# Patient Record
Sex: Male | Born: 1965
Health system: Southern US, Community
[De-identification: ages and names within clinical notes are randomized; demographics above are authoritative.]

---

## 1998-06-01 ENCOUNTER — Emergency Department (HOSPITAL_COMMUNITY): Admission: EM | Admit: 1998-06-01 | Discharge: 1998-06-01 | Payer: Self-pay | Admitting: Emergency Medicine

## 1998-06-01 ENCOUNTER — Encounter: Payer: Self-pay | Admitting: Emergency Medicine

## 1998-11-16 ENCOUNTER — Inpatient Hospital Stay: Admission: EM | Admit: 1998-11-16 | Discharge: 1998-11-21 | Payer: Self-pay | Admitting: Emergency Medicine

## 1998-11-16 ENCOUNTER — Encounter: Payer: Self-pay | Admitting: Emergency Medicine

## 1998-11-17 ENCOUNTER — Encounter: Payer: Self-pay | Admitting: Geriatric Medicine

## 1998-11-18 ENCOUNTER — Encounter: Payer: Self-pay | Admitting: Geriatric Medicine

## 1998-11-19 ENCOUNTER — Encounter: Payer: Self-pay | Admitting: Geriatric Medicine

## 1998-11-20 ENCOUNTER — Encounter: Payer: Self-pay | Admitting: Internal Medicine

## 1998-12-08 ENCOUNTER — Inpatient Hospital Stay: Admission: EM | Admit: 1998-12-08 | Discharge: 1998-12-12 | Payer: Self-pay

## 1998-12-11 ENCOUNTER — Encounter: Payer: Self-pay | Admitting: Internal Medicine

## 1999-10-10 ENCOUNTER — Encounter: Admission: RE | Admit: 1999-10-10 | Discharge: 2000-01-08 | Payer: Self-pay | Admitting: Geriatric Medicine

## 2002-01-26 ENCOUNTER — Encounter: Payer: Self-pay | Admitting: Geriatric Medicine

## 2002-01-26 ENCOUNTER — Encounter: Admission: RE | Admit: 2002-01-26 | Discharge: 2002-01-26 | Payer: Self-pay | Admitting: Geriatric Medicine

## 2002-01-29 ENCOUNTER — Encounter: Payer: Self-pay | Admitting: *Deleted

## 2002-01-29 ENCOUNTER — Inpatient Hospital Stay (HOSPITAL_COMMUNITY): Admission: EM | Admit: 2002-01-29 | Discharge: 2002-01-31 | Payer: Self-pay | Admitting: Emergency Medicine

## 2004-03-29 ENCOUNTER — Inpatient Hospital Stay (HOSPITAL_COMMUNITY): Admission: EM | Admit: 2004-03-29 | Discharge: 2004-03-30 | Payer: Self-pay

## 2004-06-17 ENCOUNTER — Ambulatory Visit: Payer: Self-pay | Admitting: Pulmonary Disease

## 2004-06-17 ENCOUNTER — Inpatient Hospital Stay (HOSPITAL_COMMUNITY): Admission: EM | Admit: 2004-06-17 | Discharge: 2004-06-29 | Payer: Self-pay | Admitting: Emergency Medicine

## 2004-07-21 ENCOUNTER — Encounter: Admission: RE | Admit: 2004-07-21 | Discharge: 2004-07-21 | Payer: Self-pay | Admitting: Geriatric Medicine

## 2004-08-08 ENCOUNTER — Encounter: Admission: RE | Admit: 2004-08-08 | Discharge: 2004-08-08 | Payer: Self-pay | Admitting: Geriatric Medicine

## 2005-04-30 ENCOUNTER — Emergency Department (HOSPITAL_COMMUNITY): Admission: EM | Admit: 2005-04-30 | Discharge: 2005-05-01 | Payer: Self-pay | Admitting: Emergency Medicine

## 2006-07-16 IMAGING — CR DG CHEST 1V PORT
1 series · 1 of 1 positions shown · non-contrast
Comparison: 06/21/04.

CLINICAL DATA: Pneumonia, respiratory distress.
 CHEST PORTABLE, ONE VIEW, 06/22/04:

[view not recorded]
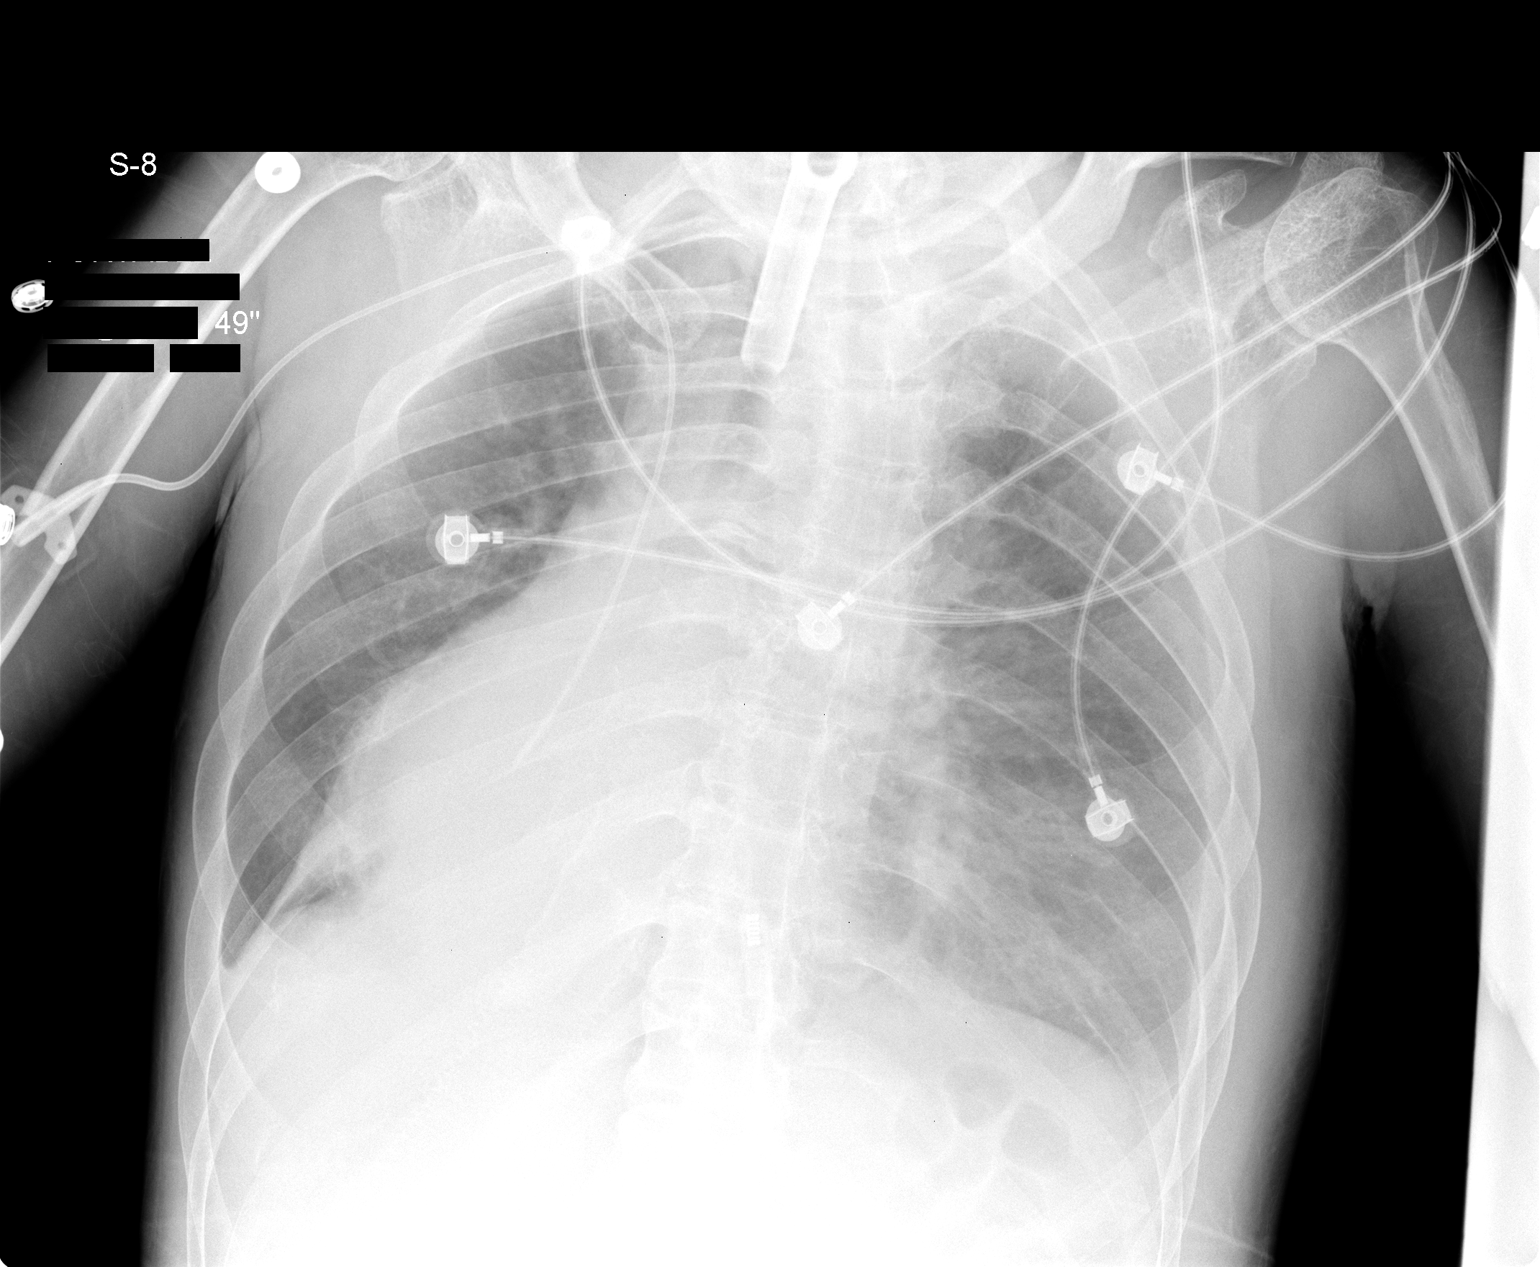

[1 of 1 positions shown; findings below may reference images not displayed]

FINDINGS: Right-sided central line is present and projects over the right side of the heart.  There is some airspace opacity at the right lung base with obscuration of the right medial hemidiaphragm, consistent with right lower lobe atelectasis or pneumothorax.  Mild interstitial prominence is present in the lung bases bilaterally.  The patient is moderately rotated to the right on today?s exam.  Tracheostomy tube is in place.
IMPRESSION: Right lower lobe atelectasis or pneumonia.  Faint interstitial prominence is present in the lung bases.

## 2006-11-05 ENCOUNTER — Inpatient Hospital Stay (HOSPITAL_COMMUNITY): Admission: EM | Admit: 2006-11-05 | Discharge: 2006-11-11 | Payer: Self-pay | Admitting: Emergency Medicine

## 2006-12-27 ENCOUNTER — Encounter: Admission: RE | Admit: 2006-12-27 | Discharge: 2006-12-27 | Payer: Self-pay | Admitting: Geriatric Medicine

## 2007-07-21 ENCOUNTER — Emergency Department (HOSPITAL_COMMUNITY): Admission: EM | Admit: 2007-07-21 | Discharge: 2007-07-22 | Payer: Self-pay | Admitting: Emergency Medicine

## 2007-07-27 ENCOUNTER — Inpatient Hospital Stay (HOSPITAL_COMMUNITY): Admission: EM | Admit: 2007-07-27 | Discharge: 2007-08-10 | Payer: Self-pay | Admitting: Emergency Medicine

## 2007-07-27 ENCOUNTER — Ambulatory Visit: Payer: Self-pay | Admitting: Pulmonary Disease

## 2007-12-14 ENCOUNTER — Ambulatory Visit: Payer: Self-pay | Admitting: Critical Care Medicine

## 2007-12-14 ENCOUNTER — Inpatient Hospital Stay (HOSPITAL_COMMUNITY): Admission: EM | Admit: 2007-12-14 | Discharge: 2007-12-23 | Payer: Self-pay | Admitting: Emergency Medicine

## 2008-01-29 ENCOUNTER — Emergency Department (HOSPITAL_COMMUNITY): Admission: EM | Admit: 2008-01-29 | Discharge: 2008-01-30 | Payer: Self-pay | Admitting: Emergency Medicine

## 2008-10-04 ENCOUNTER — Emergency Department (HOSPITAL_COMMUNITY): Admission: EM | Admit: 2008-10-04 | Discharge: 2008-10-04 | Payer: Self-pay | Admitting: Emergency Medicine

## 2009-07-13 ENCOUNTER — Encounter: Payer: Self-pay | Admitting: Critical Care Medicine

## 2009-08-04 ENCOUNTER — Encounter: Payer: Self-pay | Admitting: Critical Care Medicine

## 2009-11-12 ENCOUNTER — Encounter: Payer: Self-pay | Admitting: Critical Care Medicine

## 2010-01-04 ENCOUNTER — Encounter: Payer: Self-pay | Admitting: Critical Care Medicine

## 2010-08-22 NOTE — Letter (Signed)
Summary: [facility] [employer]  [facility] [employer]   Imported By: Lester Canfield 07/27/2009 09:41:10  _____________________________________________________________________  External Attachment:    Type:   Image     Comment:   External Document

## 2010-08-22 NOTE — Letter (Signed)
Summary: [facility]  [facility]   Imported By: Lester [place] 01/10/2010 07:42:42  _____________________________________________________________________  External Attachment:    Type:   Image     Comment:   External Document

## 2010-08-22 NOTE — Letter (Signed)
Summary: [employer] / [facility]  [employer] / [facility]   Imported By: Lennie Odor 11/18/2009 14:41:34  _____________________________________________________________________  External Attachment:    Type:   Image     Comment:   External Document

## 2010-08-25 NOTE — Letter (Signed)
Summary: [facility] [employer]  [employer] [employer]   Imported By: Lester Boyden 08/08/2009 08:38:37  _____________________________________________________________________  External Attachment:    Type:   Image     Comment:   External Document

## 2010-11-02 LAB — CULTURE, RESPIRATORY W GRAM STAIN

## 2010-11-02 LAB — URINE MICROSCOPIC-ADD ON

## 2010-11-02 LAB — URINE CULTURE: Colony Count: NO GROWTH

## 2010-11-02 LAB — URINALYSIS, ROUTINE W REFLEX MICROSCOPIC
Glucose, UA: NEGATIVE mg/dL
Leukocytes, UA: NEGATIVE
pH: 5.5 (ref 5.0–8.0)

## 2010-12-05 NOTE — H&P (Signed)
NAME:  [name], [name] NO.:  [id number]   MEDICAL RECORD NO.:  [id number]          PATIENT TYPE:  INP   LOCATION:  0103                         FACILITY:  [facility]   PHYSICIAN:  Ramiro Harvest, MD    DATE OF BIRTH:  [birth year]   DATE OF ADMISSION:  12/14/2007  DATE OF DISCHARGE:                              HISTORY & PHYSICAL   PRIMARY CARE PHYSICIAN:  Hal T. Stoneking, M.D.   HISTORY OF PRESENT ILLNESS:  [name] is a 45 year old white male  with history of cerebral palsy, nonverbal, nonambulatory, history of  seizure disorder, history of anxiety, status post tracheostomy, status  post a PEG tube, history of multiple allergies to medications recently  hospitalized in January of 2009 for pneumonia who presents from home  with a several-hour history of increased secretions, congestion,  tachycardia, tachypnea, fever with a temperature as high as 102,  productive cough of tannish sputum, wheezing and shortness of breath.  No nausea, no vomiting.  No abdominal pain, no chest pain, no  hematemesis, no hematochezia, no melena.  Liquid stools are unchanged.  History was obtained from 24-hour nurse and family.  The patient's  primary care group was called last night secondary to the patient's  symptoms, and azithromycin was called in.  The patient only got 1 dose  of azithromycin as well as a Tylenol suppository.  The patient was given  some nebulizer treatments at home with no improvement of symptoms.  EMS  was then called, and the patient was brought to the emergency room.  In  the ED, the patient was found to have a pH of 7.45, PCO2 of 35, PO2 of  53.  Chest x-ray showed a left lower lobe pneumonia.  The patient had an  elevated white count 15.4, temperature of 101, heart rate in the 160s to  170s, and we were called to admit the patient for further evaluation and  management.   ALLERGIES:  BIAXIN, CIPRO, DEMEROL, KEFLEX, PENICILLIN, PRIMAXIN, ZOSYN,  SULFA.   THE PATIENT, HOWEVER, HAS TOLERATED ROCEPHIN AND AZITHROMYCIN IN  THE PAST PER 24-HOUR NURSE.   PAST MEDICAL HISTORY:  1. History of chronic sinus tachycardia since January of 2009.  2. Anxiety disorder.  3. History of seizure disorder.  4. Cerebral palsy.  5. Status post tracheostomy.  6. Status post PEG.  7. History of recurrent small-bowel obstruction secondary to      adhesions.  8. Chronic dislocated right hip.  9. History of recurrent admissions for pneumonia.   MEDICATIONS:  1. Robitussin DM 10 mL q.6h.  2. Phenobarbital 32.4 mg per PEG q.a.m. and 64.8 mg per PEG nightly.  3. Flintstone vitamin daily.  4. Metoprolol 50 mg b.i.d. per PEG.  5. Milk of Magnesia 2.5 mL daily per PEG.  6. Melatonin 3 mg tab per PEG nightly.  7. Simethicone 80 mg t.i.d. per PEG p.r.n.  8. Lotrimin 0.1% cream to trach b.i.d. as needed.  9. Dulcolax suppository per rectum p.r.n.  10.Mylanta 200/20 mg per 5 mL 10-20 mL q.4-6h. p.r.n.  11.Tylenol 325 mg 1-2 tablets q.4-6h. p.r.n. per PEG.  12.Phenergan 12.5 mg suppository, 1/2 suppository q.6h. p.r.n.  13.Normal saline 1-3 mL p.r.n. trach.  14.Ativan 0.5 mg per PEG nightly.  15.Atrovent 0.5 to 1 mg q.4h. p.r.n. per PEG.  16.Xopenex 1.25 mg nebulizer q.4h. p.r.n.  17.O2 at 1-4 liters per minute as needed.  18.Free water flushes 20 mL every half hour p.r.n. when volume      depleted and free water flushes 200 mL q.i.d. per PEG during waking      hours.  19.Osmolite 1.5 calories at 45 mL per hour.  Hold for residuals      greater than 200 mL.  20.Small sponge between fourth and fifth digits of the left foot for      pressure relief.  21.Zithromax.   SOCIAL HISTORY:  The patient lives at home with his parents and a 13-  hour sitter.  No tobacco, no alcohol, no IV drug use.  The patient is  nonverbal and nonambulatory.   FAMILY HISTORY:  Noncontributory.   REVIEW OF SYSTEMS:  Unable to obtain.   PHYSICAL EXAMINATION:  VITAL SIGNS:   Temperature 101, blood pressure  125/85, pulse ranging from 151-167, respiratory rate 40, sating 93% on 2  liters trach.  [name]:  The patient is nonverbal.  HEENT:  Normocephalic, atraumatic.  Pupils equal, round and reactive to  light.  Extraocular movements intact.  Oropharynx is dry, clear.  No  lesions, no exudates.  NECK:  Supple.  No lymphadenopathy.  RESPIRATORY:  Diffuse rhonchi.  Expiratory wheezes.  CARDIOVASCULAR:  Tachycardiac, regular rhythm.  ABDOMEN:  Distended.  Positive bowel sounds.  Nontender to palpation.  Soft.  EXTREMITIES:  In contractures.  No clubbing, cyanosis or edema.  NEUROLOGICAL:  The patient is alert, nonverbal.   LABORATORY DATA:  CBC:  White count 15.4, hemoglobin 16.3, platelets  207, hematocrit 47.0, ANC of 13.4.  I-Stat:  Sodium 138, potassium 5.0,  chloride 109, BUN 19, creatinine 0.9, glucose 114.  ANC of 13.4, pH  7.45, PCO2 35, PO2 53, bicarb 23.   EKG shows sinus tachycardia.  Chest x-ray shows a bronchopneumonia in  the left lower base, stable mild cardiomegaly, pulmonary venous  hypertension without edema.   ASSESSMENT AND PLAN:  Mr. [name] is a 45 year old gentleman  with a history of cerebral palsy, history of anxiety disorder, seizure  disorder, recurrent hospitalizations for pneumonia recently hospitalized  in January for pneumonia and multiple drug allergies who presents to the  emergency department with fever, increased secretions, tachycardia,  tachypnea, tannish sputum, wheezing, and shortness of breath found to  have a pneumonia per chest x-ray.  1. Left lower lobe pneumonia.  Patient with multiple hospitalizations      for pneumonia recently hospitalized in January of 2009.  The      patient with a fever, tachycardia, tachypnea.  Will admit to the      step-down unit.  Will check a sputum, gram stain, and cultures.      Check blood cultures x2.  Check a urinalysis, cultures and      sensitivity.  Check a urine  Legionella and pneumococcus antigen.      Check a comprehensive metabolic profile.  Check an arterial blood      gas.  Will place the patient on oxygen, nebulizer treatments,      Mucinex, intravenous fluids and empirically with intravenous      vancomycin and gentamicin, as the patient has multiple allergies.      Will consult with CCM for line  placement.  2. Anxiety.  Continue home dose of Ativan.  3. Seizure disorder.  Check a phenobarbital level.  Restart home dose      phenobarbital.  4. Cerebral palsy.  5. Sinus tachycardia likely secondary to problem #1.  Intravenous      fluids, Lopressor 5 mg intravenously daily, and continue home dose      beta blocker.  6. Status post tracheostomy.  7. Status post percutaneous gastrostomy tube placement.  Continue      Osmolite per percutaneous gastrostomy tube and free water flushes.  8. History of recurrent small-bowel obstruction.  9. Prophylaxis.  Protonix for gastrointestinal prophylaxis.  Lovenox      for deep venous thrombosis prophylaxis.   It has been a pleasure taking care of Mr. Konnar Ben.      Ramiro Harvest, MD  Electronically Signed     DT/MEDQ  D:  12/14/2007  T:  12/14/2007  Job:  [id number]   cc:   Hal T. Stoneking, M.D.  Fax: [phone]

## 2010-12-05 NOTE — Discharge Summary (Signed)
NAMEMarland Kitchen  [name], [name]             ACCOUNT NO.:  [id number]   MEDICAL RECORD NO.:  [id number]          PATIENT TYPE:  INP   LOCATION:  1403                         FACILITY:  [facility]   PHYSICIAN:  Kela Millin, M.D.DATE OF BIRTH:  25-Nov-1965   DATE OF ADMISSION:  12/14/2007  DATE OF DISCHARGE:  12/23/2007                               DISCHARGE SUMMARY   DISCHARGE DIAGNOSES:  1. Left lower lobe pneumonia, pseudomonas.  2. Abnormal liver function tests, resolved.  3. Cerebral palsy.  4. History of sinus tachycardia.  5. Anxiety disorder.  6. Seizure disorder.  7. History of recurrent small bowel obstruction secondary to adhesion.  8. Chronic dislocated right hip.  9. History of recurrent admissions for pneumonia.  10.Status post tracheostomy.  11.Status post percutaneous endoscopic gastrostomy tube.   HISTORY:  The patient is a 45 year old white male with above listed  medical problems and multiple medication allergies, who presented with a  several hour history of increased secretions, congestion, tachycardia,  tachypnea, fever with a temperature as high as 102 as well as a cough  productive of tan sputum.  No nausea or vomiting reported.  In the ER, a  chest x-ray was done which showed left lower lobe pneumonia.  An ABG  showed a pH of 7.45 with a PCO2 of 35, PO2 of 53.  His white count was  elevated at 15.4 with a temperature of 101 and his heart rate in the 160-  170s.  He was admitted for further evaluation and management.  Please  see the full admission history and physical dictated on Dec 14, 2007 by  Dr. Ramiro [name] for the details of the admission physical  examination as well as the laboratory data.   HOSPITAL COURSE:  1. Left lower lobe pneumonia, pseudomonas.  Upon admission, the      patient was empirically started on IV vancomycin and gentamicin      given his multiple antibiotic allergies after cultures were done.      He was initially monitored in the ICU  and received aggressive      pulmonary toileting as well as nebulized bronchodilators.  The      blood cultures did not grow any bacteria.  The trache aspirates      grew pseudomonas that was only resistant to Cipro, but given his      multiple antibiotic allergies, he was maintained on gentamicin.      The blood cultures remained negative until the vancomycin was      discontinued.  The patient's blood pressures subsequently      stabilized.  Also his tachycardia improved.  In the past 48 hours,      his heart rates have ranged from 75-106.  He has been on gentamicin      for 10 days which is adequate for the treatment of his pneumonia.      He has been afebrile and hemodynamically stable.  His leukocytosis      has resolved.  His last white cell count today is 7.8.  He will be      discharged home at  this time.  He is to follow up with his primary      care physician.  2. Abnormal liver function tests.  Upon admission, his SGOT was 61      with SGPT of 141.  The other LFTs were within normal limits.  The      etiology was unclear, but on recheck, they had normalized.  SGOT of      25, SGPT of 53.  3. Seizure disorder.  The patient was maintained on his phenobarbital      during his hospital stay.  No seizures are reported during his      hospitalization.  4. History of anxiety.  The patient was maintained on his Ativan      during his hospital stay.   DISCHARGE MEDICATIONS:  1. Xopenex nebulizers 1.25 mg q.4 h. p.r.n.  2. Robitussin DM 10 cc q.6 h. p.r.n. via PEG.  3. Phenobarbital 32.4 mg q.a.m. and 64.8 mg q.h.s.  4. Flintstone Vitamins daily.  5. Metoprolol 50 mg b.i.d. per PEG.  6. Milk of Magnesia 2.5 cc daily.  7. Melatonin 3 mg via PEG q.h.s.  8. Simethicone 8 mg t.i.d. p.r.n.  9. Lotrimin 0.1% applied to trache area b.i.d. p.r.n.  10.Dulcolax suppository as previously p.r.n.  11.Ativan 0.5 mg q.h.s.   FOLLOW UP:  Dr. [name] as scheduled.   CONDITION ON DISCHARGE:   Improved, stable.      Kela Millin, M.D.  Electronically Signed     ACV/MEDQ  D:  12/23/2007  T:  12/23/2007  Job:  [id number]   cc:   [name], M.D.  Fax: [phone]

## 2010-12-05 NOTE — Discharge Summary (Signed)
NAMEMarland Kitchen  [name], [name]             ACCOUNT NO.:  [id number]   MEDICAL RECORD NO.:  [id number]          PATIENT TYPE:  INP   LOCATION:  1438                         FACILITY:  [facility]   PHYSICIAN:  [name], MDDATE OF BIRTH:  October 27, 1965   DATE OF ADMISSION:  August 12, 2007  DATE OF DISCHARGE:  08/10/2007                               DISCHARGE SUMMARY   ATTENDING PHYSICIAN:  [name] Cowboy, MD   DISCHARGE DIAGNOSES:  1. Pneumonia.  2. Chronic sinus tachycardia.  3. Anxiety.  4. History of seizure disorder.  5. Cerebral palsy.  6. Status post tracheostomy and PEG (percutaneous endoscopic      gastrostomy) tube placement.  7. History of small bowel obstruction.   PROCEDURES:  Central line placement on July 28, 2007.   STUDIES:  1. Chest x-ray done on 12-Aug-2007, showing bilateral lower lobe      pneumonia.  2. Chest x-ray July 28, 2007, showing persistent lower lobe      pneumonia.  3. Chest x-ray on July 30, 2007, showing improved lower lobe      pneumonia right side soft tissue density in bronchus intermedius      likely mucus plug and secretions.  4. Chest x-ray on July 31, 2007, showing persistent air space      disease.  5. Chest x-ray on August 06, 2007, showing interval near complete      clearing of left lower lobe pneumonia.   HISTORY AND PHYSICAL:  Please see full H and P but briefly this is an  unfortunate 45 year old gentleman with history of advanced cerebral  palsy status post trach and PEG as well as question of a seizure  disorder who presented from home where he resides with his elderly  parents and 24 hour nursing with respiratory distress and increased  tracheostomy secretions.  Chest x-ray obtained which showed bilateral  pneumonia.  Secondary to multiple allergies the patient was started on  gentamicin for presumptive possible pseudomonas infection .  the patient  is tachycardic with heart rate 150.   HOSPITAL COURSE:   Concerning his pneumonia the patient completed a 12  day course of IV gent, creatinine stayed within normal limits.  Per  chest x-ray pneumonia has improved.  He required frequent suctioning  which was provided by the nursing here as well as by his home nursing  company that stayed at his bedside throughout the admission.   Concerning his tachycardia this appears to be a chronic problem per  records reviewed at home.  We made sure that the patient was restarted  on his home dose of p.r.n. Ativan and also scheduled Ativan at night for  agitation.  TSH was checked and was within normal limits.  The patient  does not appear to be dehydrated.  We started him on a low dose of  metoprolol 12.5 mg p.o. twice a day.  The patient to have followup with  [name].  His heart rate at baseline stays at high 90s to 120s and  does go up to 150s when the patient becomes agitated when he needs to be  suctioned.   Questionable history of seizures.  Per discussion with the nursing staff  the patient may have questionable seizures where he has some jerking  movements of his right arm at home.  His phenobarbital level was checked  and was on the low side of normal at 14.  His phenobarbital dose was  increased to 45 in the morning and 60 at night. But the patient became  somewhat more somnolent , given that his phenobarbital level on his home  dose was only slightly below norm and no seizure activity we will  discharge him on his home dose.  The patient has been on his standard  dose of phenobarbital for years.  Will refer to [name] if he  needs any further adjustments.  At the time of discharge his  phenobarbital level is 16.5.   DISCHARGE MEDICATIONS:  1. Phenobarbital 32.4 per PEG in the morning and 54.8 per PEG in the      evening.  2. Lotrimin 0.1% cream to trach twice a day.  3. Multivitamin per PEG daily.  4. Dulcolax suppository per rectum as needed.  5. Mylanta per PEG as needed.   6. Phenergan per rectum as needed q.6 h.  7. Ativan 0.5 mg p.o. q.4 h p.r.n. agitation and 0.5 mg p.o. per PEG      tube daily at bedtime.  8. Robitussin DM 10 mL per PEG every 6 hours as needed.  9. Xopenex nebs inhaled q.4 h as needed.  10.Metoprolol 12.5 mg p.o. b.i.d.  11.The patient is to go home with Osmolite 1.5 cal tube feeds at 45 mL      an hour, hold for residual above 200 mL, free water boluses at 250      mL per G-tube 4x a day.   FOLLOWUP:  The patient is to follow up with [name].  The patient  is to return and seek immediate medical attention if he develops severe  respiratory distress.  The patient is to continue the suction as needed  every 2-4 hours or as needed per nursing.      [name] Cowboy, MD  Electronically Signed     AVD/MEDQ  D:  08/10/2007  T:  08/10/2007  Job:  [id number]   cc:   [name], M.D.  Fax: [phone]

## 2010-12-05 NOTE — H&P (Signed)
NAME:  [name], TISSUE NO.:  [id number]   MEDICAL RECORD NO.:  [id number]          PATIENT TYPE:  INP   LOCATION:  0102                         FACILITY:  [facility]   PHYSICIAN:  Hollice Espy, M.D.DATE OF BIRTH:  [birth year]   DATE OF ADMISSION:  07/27/2007  DATE OF DISCHARGE:                              HISTORY & PHYSICAL   PRIMARY CARE PHYSICIAN:  Hal T. Stoneking, M.D.   CHIEF COMPLAINT:  Shortness of breath.   HISTORY OF PRESENT ILLNESS:  The patient is a 45 year old white male  with a past medical history of advanced cerebral palsy, as well as  status post tracheostomy and PEG and seizure disorder who, for the last  week, is having issues with respiratory distress, increased tracheostomy  secretions, and was found to have a pneumonia.  He came to the emergency  room 6 days ago.  At the time, he was noted to be mildly tachycardic,  and he was given IV fluids to improve his heart rate, as well as started  on Zithromax.  Apparently, he has multiple drug allergies.  The patient  was sent home.  He is cared for at home by family, as well as a 24-hour  nurse, and they noted that the patient appeared to have increased  secretions, a temperature spike, and they brought him back into the  emergency room on July 27, 2007.  When they brought him in, his  previous chest x-ray showed a right perihilar and right lower lobe  atelectasis with suspicion of a possible infiltrate.  His new chest x-  ray now showed bilateral left lobe pneumonia.  The patient was also  noted to be tachycardic with a heart rate in the 150s.  IV access was  attempted, and he was able to have peripheral in his left arm and one in  his foot.  Because of his multiple drug allergies, the patient was  started on Zithromax.  Attempts were make with IV vancomycin and  gentamicin; however, IV access continues to be an issue.  The patient  himself is unable to give me any kind of review of systems  because of  his cerebral palsy.  His history is obtained from his family and the 20-  hour nurse.   PAST MEDICAL HISTORY:  1. Seizure disorder.  2. Cerebral palsy.  3. Status post tracheostomy and PEG.  4. Multiple drug allergies.  5. History of small bowel obstruction.   MEDICATIONS:  1. Robitussin DM 10 mL every 6 hours.  2. Phenobarbital 32.4 mg in the morning and 54.8 at night.  3. Lotrimin 0.1% tropical cream to tracheostomy site b.i.d.  4. Multivitamin daily.  5. Dulcolax suppository p.r.n.  6. Mylanta p.r.n.  7. Tylenol p.r.n.  8. Milk of magnesia p.r.n.  9. Phenergan p.r.n.  10.Ativan 0.5 to 1 mg q.4h. p.r.n.  11.Xopenex nebulizers p.r.n.   ALLERGIES:  1. KEFLEX.  2. PRIMAXIN.  3. ZOSYN.  4. SULFA.  5. CIPRO.  6. PERFUMES.  7. Reportedly also to [facility], although the patient was able to      tolerate Zithromax earlier.  SOCIAL HISTORY:  No tobacco, alcohol or drug use.  He resides at home  with his family and a 24-hour nurse.   FAMILY HISTORY:  Noncontributory.   PHYSICAL EXAMINATION:  VITAL SIGNS:  The patient's vitals on admission  revealed a temperature of 100, heart rate 148, blood pressure 127/88,  respirations 29, O2 saturation 92% on room air, 97% on 2 liters.  GENERAL:  He is awake, but because of his advanced cerebral palsy, I am  unable to ascertain his mental state.  HEENT:  Normocephalic and atraumatic.  He has a tracheostomy.  He is  currently receiving high-flow oxygen.  HEART:  Regular rhythm, tachycardic.  LUNGS:  He has decreased breath sounds, bibasilar.  ABDOMEN:  Soft, nontender, nondistended.  He has a PEG tube in.  The  site appears to be clean.  EXTREMITIES:  He is contracted.  No clubbing, cyanosis, or edema.   LABORATORY WORK:  Sodium 137, potassium 4.1, chloride 104, bicarbonate  25, BUN 15, creatinine 0.5, serum glucose 101.  I have ordered a CBC  with differential.  Blood cultures and phenobarbital level are all  pending.    ASSESSMENT AND PLAN:  1. Bilateral pneumonia.  The patient with multiple drug allergies.  IV      vancomycin and gentamicin have failed.  As he has failed outpatient      antibiotics, will have critical care medicine place a central line,      replace with PEG as soon as we can.  2. Cerebral palsy.  3. Seizure disorder.  Check Phenergan level.  Reported seizures in the      emergency room.  Try p.r.n. Ativan.      Hollice Espy, M.D.  Electronically Signed     SKK/MEDQ  D:  07/27/2007  T:  07/28/2007  Job:  [id number]   cc:   Hal T. Stoneking, M.D.  Fax: [phone]

## 2010-12-05 NOTE — Op Note (Signed)
NAME:  [name], [name] NO.:  [id number]   MEDICAL RECORD NO.:  [id number]          PATIENT TYPE:  INP   LOCATION:  1233                         FACILITY:  [facility]   PHYSICIAN:  Oretha Milch, MD      DATE OF BIRTH:  Apr 09, 1966   DATE OF PROCEDURE:  07/28/2007  DATE OF DISCHARGE:                               OPERATIVE REPORT   PROCEDURE PERFORMED:  Central line, venous line placement.   INDICATION FOR THE PROCEDURE:  Central venous access requested by the  admitting physician, Dr. Rito Ehrlich, in this gentleman with cerebral  palsy, pneumonia and tachycardia.  Written informed consent was obtained  from the father and brother after a detailed discussion of the risks and  benefits of the procedure.   A subclavian access was attempted without success x1 due to crowding of  the ribs, with sterile precautions under local anesthesia and using  ultrasound guidance the right internal jugular vein was visualized and a  triple lumen catheter was inserted.  Good blood flow was obtained from  all three ports.  Position was confirmed by a chest x-ray which did not  show presence of a pneumothorax.  Patient tolerated procedure well.      Oretha Milch, MD  Electronically Signed     RVA/MEDQ  D:  07/30/2007  T:  07/30/2007  Job:  [id number]

## 2010-12-08 NOTE — Discharge Summary (Signed)
NAME:  [name], [name] NO.:  [id number]   MEDICAL RECORD NO.:  [id number]                   PATIENT TYPE:  INP   LOCATION:  0474                                 FACILITY:  [facility]   PHYSICIAN:  Sherin Quarry, MD                   DATE OF BIRTH:  12-Jul-1966   DATE OF ADMISSION:  03/29/2004  DATE OF DISCHARGE:                                 DISCHARGE SUMMARY   [name] is a 45 year old man with severe cerebral palsy who lives  with his family.  He had presented to the emergency room on September 7th  after having two episodes of vomiting.  In the emergency room, he was seen  by Dr. Julio Sicks, who noticed that his blood pressure was 93/54, pulse was  102, respirations 22, temperature was 99.  The patient is a nonverbal man  who did not appear to be acutely ill.  HEENT was within normal limits.  The  chest was clear to auscultation and percussion.  Cardiovascular exam  revealed a mild tachycardia.  The abdomen had a gastrostomy tube in place.  There were normoactive bowel sounds.  There was no evidence of any guarding  or rebound.  Neurologic testing and examination of the extremities was  normal.   A KUB was done in the emergency room, which showed no signs of free air or  obstruction.   A CT scan was requested in the emergency room, but the family felt strongly  that they did not want him to have this test.   Laboratory studies obtained included a hemoglobin of 15.7.  Normal PT/PTT.  Sodium is 139, potassium 3.7.  Creatinine was 0.6.  BUN was 15.  Amylase and  lipase were normal.  Urinalysis was remarkable only for the presence of  ketones.  A phenobarbital level was done, which was 30.8.   The patient was admitted for observation and received an IV of D5-1/2 normal  saline at 100 cc/hr.  Protonix 40 mg was given IV daily.  DVT prophylaxis  was instituted.   By September 8th, the patient was feeling much better.  He was no longer  experiencing vomiting.  In the morning, I resumed his Ensure HN2 feedings.  He tolerated these well.  The patient's father and brother both agreed that  he had returned to his normal state.  They strongly wished him to return  home; therefore, on September 8th, the patient was discharged.   DISCHARGE DIAGNOSES:  1.  Cerebral palsy with history of seizure disorder.  2.  History of bowel resection surgery.  3.  Transient nausea and vomiting, possibly gastroenteritis.   Patient will be discharged on his usual medications, which are phenobarbital  50 mg per PEG tube b.i.d., Robitussin-DM 10 cc q.i.d., albuterol 2.5 mg  q.i.d. p.r.n. as a nebulizer, and Dulcolax p.r.n.  He will follow up with  his primary doctor, Dr. Pete Glatter.  Sherin Quarry, MD    SY/MEDQ  D:  03/30/2004  T:  03/30/2004  Job:  [id number]   cc:   Hal T. Stoneking, M.D.  [address] [place], [place] [address]  Fax: [phone]

## 2010-12-08 NOTE — H&P (Signed)
[facility]  Patient:    [name], [name] Visit Number: [id number] MRN: [id number]          Service Type: MED Location: 3W [phone] Attending Physician:  Danae Chen Dictated by:   Mary A. Placey, P.A. Admit Date:  01/29/2002 Discharge Date: 01/31/2002                           History and Physical  CHIEF COMPLAINT:  Tachycardia, decreased breath sounds, temperature of 101.6, on Levaquin for Pseudomonas ?Marland Kitchen  Chest x-ray initially showed early atelectasis.  HISTORY OF PRESENT ILLNESS:  Ill for 7-10 days.  Chest x-ray initially showed atelectasis.  Sputum grew out questionable Pseudomonas.  Now has been on Levaquin for seven days with no improvement.  Decision to admit.  DRUG ALLERGIES:  SULFA and CIPRO.  CURRENT MEDICATIONS: 1. Phenobarbital 60 mg p.o. b.i.d. 2. Robitussin-DM 10 cc q.i.d. 3. Albuterol 2.5 mg q.i.d. p.r.n. by mask. 4. Ensure HN tube feedings with free water.  PAST MEDICAL HISTORY: 1. Cerebral palsy. 2. Seizure disorder. 3. Chronic dislocation of the right hip. 4. 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  PAST MEDICAL HISTORY: 1. Multiple surgeries for small-bowel obstruction. 2. Gastrostomy. 3. Tracheostomy.  FAMILY HISTORY:  Father with arthritis.  Mother with arthritis of the knees. Father has some high blood pressure.  Brother in good health.  SOCIAL HISTORY:  The patient lives with his parents at home.  He is single. He is totally disabled.  Has never been able to work.  Does not smoke or consume alcohol.  REVIEW OF SYSTEMS:  Unobtainable due to nonverbal.  PHYSICAL EXAMINATION:  VITAL SIGNS:  Blood pressure 102/80, pulse 140 and regular, temperature 101.6.  HEENT:  Pupils are equal, round, and reactive to light.  Fundi normal.  TMs partially occluded with cerumen.  Oropharynx:  No mucosal lesions.  LUNGS:  Decreased breath sounds throughout.  Tracheostomy site looks good.  ABDOMEN:  Soft, with no  hepatosplenomegaly or masses palpated.  Gastrostomy tube reveals a little bit of granulation tissue along the side.  EXTREMITIES:  No lower extremity edema.  ASSESSMENT:  Tachycardia, decreased breath sounds, and fever.  Already on Levaquin.  PLAN:  Admit for further evaluation and IV antibiotics.  ______ consult with Dr. Ulyess Mort. Dictated by:   Mary A. Placey, P.A. Attending Physician:  Danae Chen DD:  01/29/02 TD:  02/01/02 Job: [id number]

## 2010-12-08 NOTE — Discharge Summary (Signed)
NAME:  [name], [name]             ACCOUNT NO.:  [id number]   MEDICAL RECORD NO.:  [id number]          PATIENT TYPE:  INP   LOCATION:  0366                         FACILITY:  [facility]   PHYSICIAN:  Corinna L. Lendell Caprice, MDDATE OF BIRTH:  07-14-1966   DATE OF ADMISSION:  06/17/2004  DATE OF DISCHARGE:  06/29/2004                                 DISCHARGE SUMMARY   DIAGNOSES:  1.  Pseudomonas pneumonia.  2.  History of seizures.  3.  Cerebral palsy.  4.  Tracheostomy.  5.  History of partial small bowel resection for obstruction.  6.  Chronic dislocation of the right hip.   DISCHARGE MEDICATIONS:  1.  Phenobarbital 30 mg per G tube q.a.m., 60 mg per G tube q.h.s.  2.  Albuterol nebulizer as needed.  3.  Ambien 10 mg per G tube q.h.s. p.r.n. sleep.  4.  Ativan 0.5 mg per G tube every 8 hours as needed for agitation.   CONDITION:  Stable.   CODE STATUS:  Full code.   DIET:  He is to continue his same tube feedings.   FOLLOW UP:  Followup with Dr. Merlene Laughter in a month.   PERTINENT LABORATORIES:  Strep, pneumonia, urinary, antigen negative.  Respiratory culture grew out pseudomonas aeruginosa which was indeterminate  as __________ resistant to ciprofloxacin and Bactrim.  Urine legionella  negative.  Blood cultures negative.  Phenobarbital 25, B type natruretic  peptide less than 30. Complete metabolic panel on admission was significant  for potassium of 30.3, ALT was 51, the rest of his CMET was unremarkable.  CBC on admission was unremarkable. ABG on 100% face mask revealed a pH of  7.485, pCO2 29, PO2 141.   SPECIAL STUDIES AND RADIOLOGY:  Chest x-ray on admission showed bilateral  perihilar interstitial opacities consistent with interstitial edema or  atypical pneumonia, dextrocardia. Serial chest x-rays were done.  EKG showed  sinus tachycardia of a rate of 140, right atrial enlargement.   HOSPITAL COURSE:  [name] is a 45 year old white male with a history of  cerebral palsy, seizures, trach and PEG who presented to the hospital and  was diagnosed with pneumonia.  His family had noted worsening respiratory  distress over the last several days. He had been on Levaquin for a week and  Augmentin for a day.  He has 24 hour nursing staff at home.  His heart rate  was 122 on presentation, blood pressure was 124/91, respiratory rate 24.  He  had wheezing, tachycardia.  He was initially admitted to the floor by Dr.  Merril Abbe but this was changed to step down by Dr. Ladona Ridgel.  He was started  on vancomycin and Azactam.  He grew out Pseudomonas and was started on  gentamycin and Zosyn by Dr. Tresa Endo.  The patient's wheezing and respiratory  distress improved.  He had  completed a full course of treatment for his pseudomonal pneumonia and is  stable for discharge.  He did have a seizure during his hospitalization and  was given p.r.n. Ativan.  The patient had been in Imipenem at the time of  the seizure  and this was switched to Zosyn.     Cori   CLS/MEDQ  D:  06/29/2004  T:  06/30/2004  Job:  [id number]   cc:   Hal T. Stoneking, M.D.  [address] [place], [place] [address]  Fax: [phone]

## 2010-12-08 NOTE — H&P (Signed)
NAME:  [name], [name]             ACCOUNT NO.:  [id number]   MEDICAL RECORD NO.:  [id number]          PATIENT TYPE:  INP   LOCATION:  1439                         FACILITY:  [facility]   PHYSICIAN:  Kela Millin, M.D.DATE OF BIRTH:  May 07, 1966   DATE OF ADMISSION:  11/05/2006  DATE OF DISCHARGE:                              HISTORY & PHYSICAL   PRIMARY CARE PHYSICIAN:  Hal T. Stoneking, M.D.   CHIEF COMPLAINT:  Cough and fever.   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  Presents with the above complaint.  The patient lives at home with his elderly parents and has home health  care services.  At baseline, he is nonverbal and nonambulatory.  The  report that in the past 2-3 days, patient has had a worsening cough  productive of yellowish and now greenish sputum.  In the past 1-2 days,  he has also had fevers.  Per the home health nurse, he is on home O2,  and his O2 sats have been in the 91-92 range, which is a drop for him.  No reports of nausea, vomiting, chest pain, melena.  Also no diarrhea  reported.   The patient was seen in the ER, and the chest x-ray perihilar  infiltrate.  His temperature was initially 104.5, and his white cell  count elevated at 14.  The patient was also noted to be tachycardic,  rate going into the 150s, and D-dimer elevated at 0.85.  He is admitted  to the [facility] service for further evaluation and management.   PAST MEDICAL HISTORY:  As above.   MEDICATIONS:  1. Albuterol nebulizer p.r.n.  2. Lotrimin.  3. Multivitamins.  4. Phenobarbital 30 mg in the a.m. and 60 in the p.m.  5. Robitussin-DM.  6. Ativan 0.5 to 1 mg q.4h. p.r.n.  7. Phenergan.   ALLERGIES:   BIAXIN, CIPRO, DEMEROL, KEFLEX, PENICILLIN, PRIMAXIN, ZOSYN.   Per primary care physician, the patient has tolerated Zithromax in the  past as well as Rocephin.  His reaction to the other antibiotics  reported to be at most a rash.   SOCIAL HISTORY:  As above, the patient lives at home with his parents.  No tobacco.  No alcohol.   FAMILY HISTORY:  His father has hypertension.   REVIEW OF SYSTEMS:  As per HPI.   PHYSICAL EXAMINATION:  GENERAL:  The patient is a middle-aged white  male.  He has a tracheostomy tube in place.  No eye contact.  Nonverbal.  No respiratory distress.  VITAL SIGNS:  Temperature initially 104.5.  Current temperature 102.3.  His blood pressure is 137/57.  Pulse 151, 71 at its lowest.  O2 sats of  94%.  HEENT:  Normocephalic and atraumatic.  Sclerae are anicteric.  No  exudates.  NECK:  He has a tracheostomy tube, intermittently coughing up greenish  sputum through the tube.  No JVD appreciated.  No thyromegaly.  LUNGS:  Rhonchi present bilaterally.  No wheezes.  CARDIOVASCULAR:  Tachycardic.  Normal S1 and S2.  No S4 or S3  appreciated.  ABDOMEN:  He has a PEG tube present in the left upper abdominal area.  No induration.  No drainage around the PEG tube.  Bowel sounds are  present.  Nontender.  Nondistended.  No organomegaly and no masses  palpable.  EXTREMITIES:  He has contractures present.  No edema.  No cyanosis.  NEURO:  His eyes are open.  No eye contact.  Does not follow any  commands.  Limiting neuro exam.   LABORATORY DATA:  Chest x-ray shows dextrocardia, new perihilar  interstitial densities, question of pneumonia versus acute pulmonary  edema.   The white cell count is 14, hemoglobin 15.6, hematocrit 44.2, platelet  count 157, neutrophil count is 90%.  ABG shows a pH of 7.46, pCO2 31.4,  pO2 71.5, bicarb 22.3, O2 sat 95.1.  Urinalysis is negative for  infection.  Sodium is 134 with a potassium of 3.6, chloride 102, CO2 21,  glucose 118,  BUN 16, creatinine 0.71, calcium 8.3.  Total protein is  6.7, albumin 3.5, AST 40.  D-dimer 0.85.  Phenobarbital 23.9.   ASSESSMENT/PLAN:  1. Pneumonia:  As discussed above.  Will obtain blood cultures.      Continue empiric antibiotics started in the ER, Rocephin and      Zithromax.  Will also obtain a B-type natriuretic peptide and      follow.  Will place the patient on bronchodilators as well as      mucolytics and follow.  As noted above, the patient's D-dimer is      also elevated.  With his risk factors for PE, will obtain a CT      angiogram of chest to rule out a PE and follow.  2. Tachycardia:  Likely secondary to fevers/infection but as noted      above, patient's D-dimer also elevated.  Will obtain a CT angiogram      for pulmonary embolus and follow.  Will hydrate and treat with      antibiotics, as above.  Will obtain EKG and monitor on telemetry      and follow.  3. History of seizure disorder:  Will continue on phenobarbital.      Level in therapeutic range, as above.      Kela Millin, M.D.  Electronically Signed     ACV/MEDQ  D:  11/06/2006  T:  11/07/2006  Job:  [id number]   cc:   Hal T. Stoneking, M.D.  Fax: [phone]

## 2010-12-08 NOTE — Discharge Summary (Signed)
NAME:  [name], [name] NO.:  [id number]   MEDICAL RECORD NO.:  [id number]          PATIENT TYPE:  INP   LOCATION:  1439                         FACILITY:  [facility]   PHYSICIAN:  Theone Stanley, MD   DATE OF BIRTH:  1965-12-29   DATE OF ADMISSION:  11/05/2006  DATE OF DISCHARGE:                               DISCHARGE SUMMARY   ADMISSION DIAGNOSES:  Fever, cough, shortness of breath, seizure  disorder, cerebral palsy, chronic trach, chronic PEG tube, chronic  dislocation of his right hip, history of recurrent small bowel  obstruction secondary to adhesions.   DISCHARGE DIAGNOSIS:  Multiple lobar pneumonia.  Fever, cough, shortness  of breath, seizure disorder, cerebral palsy, chronic trach, chronic PEG  tube, chronic dislocation of his right hip, history of recurrent small  bowel obstruction secondary to adhesions.   CONSULTATIONS:  None.   PROCEDURES / DIAGNOSTIC TESTS:  Patient had a CT angio of the chest  which showed no pulmonary embolism, multilobar pneumonia, probable  airspace consolidation in left perihilar region, mass not excluded, left  upper lobe.  Nodule may be infectious or inflammatory.  Follow up in 4-8  weeks.  Recommended for early reevaluation as clinically indicated.  Likely reactive right peritracheal lymph node.   PERTINENT LABS:  Last set of labs:  Blood cultures were negative times  two.  BMP on April 19 showed a sodium of 143, potassium of 4.2, chloride  at 111, CO2 of 27, glucose at 121, BUN at 9, creatinine at 0.4.  WBC at  4.6, hemoglobin of 12, hematocrit 36, platelets at 127.   HOSPITAL COURSE:  [name] is a very pleasant 45 year old gentleman  who is taken care of at home by his mother and father and round-the-  clock nursing care.  He has a history of severe cerebral palsy, seizure  disorder.  He presents after it was noted that he had cough and fever.  On presentation to the ER, x-rays showed perihilar infiltrate.   Because  the D-dimer was elevated a CT angio was performed which showed  multilobar pneumonia.  The patient was started on azithromycin and  Rocephin.  The patient continued to have a large amount of sputum  production; however, over the next couple of days during his stay he  improved.  On April 21 he was afebrile.  White count was normal.  It was  felt that he could be discharged.  The patient is discharged home in  stable condition.   DISCHARGE MEDICATIONS:  Ceftin 100 p.o. b.i.d. for an additional 6 days.  He is to finish his Z-Pak that he had at home.  Robitussin DM 10 ml per PEG every 6 hours p.r.n.  Phenobarbital 30 mg per PEG daily/  Lotrimin cream 1% 1/2 inch to trach site b.i.d. p.r.n.  Phenobarb 60 mg per PEG q.h.s.  Flintstone chewable tablet 1 p.o. once daily,  Tylenol suppository or per G-tube 325 every 4-6 hours p.r.n.  Milk of Magnesia 2.5-5 cc per PEG every 12 hours p.r.n.  Phenergan suppositories 6.25 mg per rectum q.6h. p.r.n.  Albuterol nebs every 4-6 hours  p.r.n.  Ativan 0.5 to 1 mg per PEG every 4 hours p.r.n.   The following instructions were given for care:  Suction every 4 hours  and as needed.  Patient's family should not need his humidity fluid.  Change the trach every month and sooner if needed.  Change inner cannula  every 8 hours unless needed sooner.  To keep the room temperature at 70  degrees.  Would recommend Jevity 1.2 at a goal of 50 ml an hour.  Free  water 100 cc every 4 hours.      Theone Stanley, MD  Electronically Signed     AEJ/MEDQ  D:  11/11/2006  T:  11/11/2006  Job:  [id number]   cc:   Hal T. Stoneking, M.D.  Fax: [phone]

## 2010-12-08 NOTE — H&P (Signed)
NAME:  VALERIO, PINARD NO.:  [id number]   MEDICAL RECORD NO.:  [id number]          PATIENT TYPE:  EMS   LOCATION:  ED                           FACILITY:  [facility]   PHYSICIAN:  Ike Bene, M.D. DATE OF BIRTH:  1965-08-15   DATE OF ADMISSION:  06/17/2004  DATE OF DISCHARGE:                                HISTORY & PHYSICAL   Mr. Murakami is a 45 year old male with a history of cerebral palsy brought  to the emergency department by his family after worsening respiratory  distress over the last several days.  He has completed one week of Levaquin  and one day of Augmentin therapy.  He has worsened over the last day or so  with increasing shortness of breath which actually required manual  ventilation by his 24-hour nursing care staff.  He is regularly fed by PEG  with continuous feedings and has a tracheostomy in place and needs regular  suctioning for that.   PAST MEDICAL HISTORY:  1.  Cerebral palsy.  2.  Seizure disorder.  3.  History of small bowel obstruction from adhesions.  4.  Chronic dislocation of the right hip.   PAST SURGICAL HISTORY:  1.  Past partial small bowel resection from a small bowel obstruction.  2.  Tracheostomy.   CURRENT MEDICATIONS:  1.  Phenobarbital 30 mg via PEG q.a.m., 60 mg via PEG q.p.m.  2.  Albuterol nebulizers p.r.n.  3.  Robitussin 10 mL via PEG q.6h.   ALLERGIES:  SULFA, CIPRO, BIAXIN, PENICILLIN, KEFLEX.   SOCIAL HISTORY:  Patient is disabled from cerebral palsy, lives with his  parents.  He is nonlocal.  No tobacco.  No alcohol.   FAMILY HISTORY:  Father has hypertension and arthritis.  Mother has  osteoarthritis.  One brother apparently healthy.   REVIEW OF SYSTEMS:  Difficult to obtain but based on family and nursing  staff, no other new symptoms other than those related to his respiratory  symptomatology.  No change in bowel or bladder function.  He has had no  vomiting.   PHYSICAL EXAMINATION:  VITAL SIGNS:   Blood pressure 124/91, heart rate 122.  He is afebrile.  Respiratory rate 24.  HEENT:  Fairly unremarkable.  LUNGS:  Inspiratory and expiratory wheezes.  HEART:  Tachycardic, but regular.  ABDOMEN:  Soft, nontender, nondistended.  Positive bowel sounds.  EXTREMITIES:  No clubbing, cyanosis, edema.   Chest x-ray shows diffuse pattern consistent with an atypical pneumonia.  His BNP is less than 30.  White count 10.3, hemoglobin 15.3, platelet count  246.  Metabolic panel is noted for a slightly low potassium.   IMPRESSION AND PLAN:  1.  Will treat him for his respiratory distress for an atypical pneumonia      pattern with marked bronchospasm.  He has failed outpatient therapy with      Levaquin and Augmentin.  I am going to treat him with azithromycin, also      start him on Solu=Medrol systemically and albuterol and Atrovent      nebulized therapy.  2.  Seizure disorder.  Continue on phenobarbital.  3.  Full code blue.  4.  Slight hypokalemia.  Will replete via PEG.      RM/MEDQ  D:  06/17/2004  T:  06/17/2004  Job:  [id number]

## 2010-12-08 NOTE — H&P (Signed)
NAME:  [name], [name] NO.:  [id number]   MEDICAL RECORD NO.:  [id number]                   PATIENT TYPE:  INP   LOCATION:  0474                                 FACILITY:  [facility]   PHYSICIAN:  Jackie Plum, M.D.             DATE OF BIRTH:  1965-08-25   DATE OF ADMISSION:  03/29/2004  DATE OF DISCHARGE:                                HISTORY & PHYSICAL   CHIEF COMPLAINT:  Patient is not doing well.   HISTORY OF PRESENT ILLNESS:  Patient was brought in by his family because he  had not been doing well.  According to the father, the patient, who lives  with him, has been very much not like himself.  This has been going on for  about a week.  He also says that the patient vomited two times over the last  two days.  The family brought the patient to the ER mainly because they  found some brownish secretions around his J-tube site.  He also stated that  the patient is looked after 24 hours a day by several nursing staff, and one  of them was recently diagnosed with some viral illness.  No other history  available at this point.   PAST MEDICAL HISTORY:  1.  History of bowel resection surgery.  2.  Patient has a history of seizure disorder, for which he takes      phenobarbital.   MEDICATIONS:  1.  Phenobarbital 50 mg per PEG tube b.i.d.  2.  Robitussin-DM 10 cc q.i.d.  3.  Albuterol 2.5 mg q.i.d. p.r.n. nebs.  4.  Dulcolax p.r.n.   Patient is said to be intolerant to SULFA, CIPRO, DEMEROL, PENICILLIN,  BIAXIN, KEFLEX, which is said to induce some sort of hives and rashes.   SOCIAL HISTORY:  The patient lives with his father and mother.  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  He is usually nonvocal.  He does not  smoke cigarettes or drink alcohol.   REVIEW OF SYSTEMS:  Unobtainable.   PHYSICAL EXAMINATION:  VITAL SIGNS:  BP was 93/54, pulse 102, respiratory  rate 22, temp 99.1 degrees Fahrenheit.  Sat of 95%.  GENERAL:  At the time of  admission, he was awake but nonverbal.  He was not  acutely ill-looking and did not seem to be in any distress.  HEENT:  Normocephalic and atraumatic.  Pupils are equal, round and reactive  to light.  Extraocular movements are intact.  Oropharynx is moist.  NECK:  Supple.  No JVD.  He actually had a trach mask in situ.  LUNGS:  Fascicular breath sounds.  No crackles or wheezes.  HEART:  Mildly tachycardic.  There are no gallops or murmur.  ABDOMEN:  He has an obvious J-tube in situ with subumbilical surgical scar  with a good heal.  Abdominal exam revealed normoactive bowel sounds.  There  is no evidence of any tenderness.  Abdomen was soft to palpation.  EXTREMITIES:  He had contractures of all extremities.  No edema.  NEUROLOGIC:  Patient was awake but nonverbal.  No obvious acute deficits  appreciated.   LAB WORK:  CT scan was ordered in the ED, but the family refused the  testing.  Because of concerns about previously contrast-induced aspiration.   Bloods revealed WBC count of 17.1, hemoglobin 15.7, hematocrit 46.5, MCV  96.3, platelet count 174.  Absolute neutrophil count of 14.9% with  lymphocytes of 0.7.  Pro time 1.0, PTT 26.  Sodium 139, potassium 3.7,  chloride 104, CO2 28, glucose 102, BUN 15, creatinine 0.6.  The rest of his  complete metabolic panel was reviewed and was within normal limits.  Amylase  and lipase were also within normal limits.  A UA was done.  The appearance  was turbid with more than 80 ketones.  There was no evidence of urinary  tract infection.  Phenobarbital level was 30.8 mcg/ml.   ASSESSMENT:  A 45 year old Caucasian gentleman with a history of cerebral  palsy, who is disabled and usually nonverbal, who was brought in by family  on account of two episodes of vomiting.  He is also said to be not well;  however, the family is not able to specifically indicate what they feel is  wrong with the patient, they just say that he does not look like his  normal  self.  Lab work is as noted above.  Patient had been exposed to someone who  had some form of viral illness.   IMPRESSION:  Probable viral syndrome/gastroenteritis.  Patient will be  admitted to the hospital for observation.  According to the RN in the ED, he  has not had any evidence of vomiting while he was in the ED for nine hours  prior to the hospitalist service being consulted by the ED physician.  We  will start IV fluids on the patient and other supportive measures.  He has  leukocytosis, and this will be repeated in the morning.  Patient has had  bowel surgery previously, according to the family, and there have been  concerns regarding a small bowel obstruction; however, clinically, there is  no evidence of this.  We will go ahead and get an abdominal x-ray to rule  out small bowel obstruction.  Patient will be seen tomorrow by Dr. Tresa Endo.  Further recommendations will be instituted as the data base expands.                                               Jackie Plum, M.D.    GO/MEDQ  D:  03/29/2004  T:  03/29/2004  Job:  [id number]

## 2011-04-11 LAB — BASIC METABOLIC PANEL
BUN: 15
BUN: 6
BUN: 6
BUN: 7
BUN: 7
BUN: 8
BUN: 9
CO2: 27
CO2: 27
CO2: 29
CO2: 31
CO2: 32
Calcium: 8.1 — ABNORMAL LOW
Calcium: 8.2 — ABNORMAL LOW
Calcium: 8.4
Calcium: 8.6
Calcium: 8.6
Calcium: 9.4
Chloride: 104
Chloride: 108
Chloride: 118 — ABNORMAL HIGH
Creatinine, Ser: 0.5
Creatinine, Ser: 0.54
Creatinine, Ser: 0.6
Creatinine, Ser: 0.69
Creatinine, Ser: 0.89
GFR calc Af Amer: >60
GFR calc Af Amer: >60
GFR calc Af Amer: >60
GFR calc Af Amer: >60
GFR calc non Af Amer: >60
GFR calc non Af Amer: >60
GFR calc non Af Amer: >60
GFR calc non Af Amer: >60
GFR calc non Af Amer: >60
GFR calc non Af Amer: >60
GFR calc non Af Amer: >60
GFR calc non Af Amer: >60
GFR calc non Af Amer: >60
Glucose, Bld: 101 — ABNORMAL HIGH
Glucose, Bld: 106 — ABNORMAL HIGH
Glucose, Bld: 125 — ABNORMAL HIGH
Glucose, Bld: 130 — ABNORMAL HIGH
Glucose, Bld: 144 — ABNORMAL HIGH
Glucose, Bld: 154 — ABNORMAL HIGH
Glucose, Bld: 156 — ABNORMAL HIGH
Potassium: 2.8 — ABNORMAL LOW
Potassium: 3.7
Potassium: 3.9
Potassium: 4.1
Potassium: 4.3
Sodium: 135
Sodium: 137
Sodium: 138
Sodium: 141
Sodium: 142

## 2011-04-11 LAB — CBC
HCT: 32.8 — ABNORMAL LOW
HCT: 33.4 — ABNORMAL LOW
HCT: 37.2 — ABNORMAL LOW
HCT: 42.5
Hemoglobin: 11.4 — ABNORMAL LOW
Hemoglobin: 11.4 — ABNORMAL LOW
Hemoglobin: 13
Hemoglobin: 14.8
MCHC: 34.1
MCHC: 34.6
MCV: 94.7
MCV: 94.9
Platelets: 155
Platelets: 158
Platelets: 176
RBC: 3.46 — ABNORMAL LOW
RBC: 3.92 — ABNORMAL LOW
RBC: 4.19 — ABNORMAL LOW
RDW: 12.4
RDW: 12.4
RDW: 12.5
WBC: 10.4
WBC: 10.7 — ABNORMAL HIGH
WBC: 9.8

## 2011-04-11 LAB — CULTURE, RESPIRATORY W GRAM STAIN

## 2011-04-11 LAB — CULTURE, BLOOD (ROUTINE X 2)
Culture: NO GROWTH
Culture: NO GROWTH
Culture: NO GROWTH

## 2011-04-11 LAB — URINALYSIS, ROUTINE W REFLEX MICROSCOPIC
Bilirubin Urine: NEGATIVE
Glucose, UA: NEGATIVE
Specific Gravity, Urine: 1.016
Urobilinogen, UA: 0.2

## 2011-04-11 LAB — DIFFERENTIAL
Basophils Absolute: 0
Eosinophils Relative: 0
Lymphs Abs: 1.3
Monocytes Relative: 5
Neutro Abs: 16.2 — ABNORMAL HIGH

## 2011-04-11 LAB — PHENOBARBITAL LEVEL
Phenobarbital: 14.7 — ABNORMAL LOW
Phenobarbital: 16.1

## 2011-04-11 LAB — URINE CULTURE: Culture: NO GROWTH

## 2011-04-11 LAB — GENTAMICIN LEVEL, RANDOM: Gentamicin Rm: 4.4

## 2011-04-11 LAB — URINE MICROSCOPIC-ADD ON

## 2011-04-12 LAB — BASIC METABOLIC PANEL
BUN: 11
BUN: 12
BUN: 12
BUN: 12
CO2: 25
Calcium: 8.7
Calcium: 9.1
Chloride: 101
Creatinine, Ser: 0.55
Creatinine, Ser: 0.6
Creatinine, Ser: 0.61
GFR calc non Af Amer: >60
GFR calc non Af Amer: >60
GFR calc non Af Amer: >60
Glucose, Bld: 113 — ABNORMAL HIGH
Glucose, Bld: 123 — ABNORMAL HIGH
Glucose, Bld: 88
Potassium: 3.8
Potassium: 4.1
Sodium: 135

## 2011-04-12 LAB — CBC
HCT: 32.8 — ABNORMAL LOW
HCT: 34.3 — ABNORMAL LOW
HCT: 36.6 — ABNORMAL LOW
Hemoglobin: 11.8 — ABNORMAL LOW
MCHC: 34.4
MCV: 93.9
Platelets: 194
Platelets: 218
Platelets: 268
Platelets: 268
RDW: 12.3
RDW: 12.6
RDW: 12.7
RDW: 12.9
WBC: 10.4

## 2011-04-12 LAB — URINALYSIS, ROUTINE W REFLEX MICROSCOPIC
Nitrite: NEGATIVE
Protein, ur: NEGATIVE
Specific Gravity, Urine: 1.009
Urobilinogen, UA: 0.2

## 2011-04-12 LAB — URINE MICROSCOPIC-ADD ON

## 2011-04-12 LAB — PHENOBARBITAL LEVEL: Phenobarbital: 16.5

## 2011-04-12 LAB — TSH: TSH: 2.179

## 2011-04-18 LAB — DIFFERENTIAL
Basophils Absolute: 0
Basophils Absolute: 0
Basophils Absolute: 0.1
Basophils Relative: 0
Basophils Relative: 0
Basophils Relative: 1
Eosinophils Absolute: 0
Eosinophils Relative: 1
Eosinophils Relative: 6 — ABNORMAL HIGH
Lymphocytes Relative: 20
Lymphocytes Relative: 7 — ABNORMAL LOW
Lymphs Abs: 1.4
Monocytes Absolute: 0.6
Monocytes Relative: 14 — ABNORMAL HIGH
Monocytes Relative: 21 — ABNORMAL HIGH
Neutro Abs: 13.4 — ABNORMAL HIGH
Neutro Abs: 3.7
Neutrophils Relative %: 57
Neutrophils Relative %: 76
Neutrophils Relative %: 87 — ABNORMAL HIGH

## 2011-04-18 LAB — CBC
Hemoglobin: 11.4 — ABNORMAL LOW
Hemoglobin: 12 — ABNORMAL LOW
Hemoglobin: 12.2 — ABNORMAL LOW
MCHC: 34.2
MCHC: 34.2
MCHC: 34.3
MCHC: 34.6
MCV: 93
MCV: 93
MCV: 93.5
Platelets: 161
Platelets: 170
RBC: 3.56 — ABNORMAL LOW
RBC: 3.63 — ABNORMAL LOW
RBC: 3.89 — ABNORMAL LOW
RBC: 5.05
RDW: 11.8
RDW: 12.4
RDW: 12.5
WBC: 5.9
WBC: 6.6
WBC: 6.8

## 2011-04-18 LAB — CULTURE, RESPIRATORY W GRAM STAIN

## 2011-04-18 LAB — POCT I-STAT, CHEM 8
Glucose, Bld: 114 — ABNORMAL HIGH
HCT: 50
Hemoglobin: 17
Potassium: 5
Sodium: 138

## 2011-04-18 LAB — BLOOD GAS, ARTERIAL
Acid-base deficit: 1
Bicarbonate: 22.1
Drawn by: 187461
FIO2: 0.21
Patient temperature: 98.6
TCO2: 19.1
pCO2 arterial: 34.8 — ABNORMAL LOW
pH, Arterial: 7.434
pH, Arterial: 7.445

## 2011-04-18 LAB — BASIC METABOLIC PANEL
BUN: 5 — ABNORMAL LOW
BUN: 7
BUN: 7
CO2: 24
CO2: 26
CO2: 26
Calcium: 8 — ABNORMAL LOW
Calcium: 8.7
Calcium: 9.1
Chloride: 106
Chloride: 108
Chloride: 110
Creatinine, Ser: 0.51
Creatinine, Ser: 0.56
Creatinine, Ser: 0.56
Creatinine, Ser: 0.6
GFR calc Af Amer: >60
GFR calc Af Amer: >60
GFR calc Af Amer: >60
GFR calc non Af Amer: >60
GFR calc non Af Amer: >60
GFR calc non Af Amer: >60
Glucose, Bld: 117 — ABNORMAL HIGH
Glucose, Bld: 121 — ABNORMAL HIGH
Sodium: 140
Sodium: 141

## 2011-04-18 LAB — COMPREHENSIVE METABOLIC PANEL
ALT: 53
AST: 25
AST: 61 — ABNORMAL HIGH
Albumin: 3.9
Alkaline Phosphatase: 51
CO2: 26
Calcium: 9.3
Chloride: 104
Creatinine, Ser: 0.64
GFR calc Af Amer: >60
GFR calc Af Amer: >60
GFR calc non Af Amer: >60
Glucose, Bld: 110 — ABNORMAL HIGH
Potassium: 3.7
Sodium: 142
Total Bilirubin: 0.6

## 2011-04-18 LAB — URINALYSIS, ROUTINE W REFLEX MICROSCOPIC
Bilirubin Urine: NEGATIVE
Ketones, ur: NEGATIVE
Ketones, ur: NEGATIVE
Nitrite: NEGATIVE
Nitrite: NEGATIVE
Protein, ur: NEGATIVE
Urobilinogen, UA: 0.2
Urobilinogen, UA: 0.2

## 2011-04-18 LAB — HEPATIC FUNCTION PANEL
Albumin: 2.9 — ABNORMAL LOW
Total Bilirubin: 0.6
Total Protein: 5.5 — ABNORMAL LOW

## 2011-04-18 LAB — LEGIONELLA ANTIGEN, URINE

## 2011-04-18 LAB — CULTURE, BLOOD (ROUTINE X 2)
Culture: NO GROWTH
Culture: NO GROWTH
Culture: NO GROWTH

## 2011-04-18 LAB — STREP PNEUMONIAE URINARY ANTIGEN: Strep Pneumo Urinary Antigen: NEGATIVE

## 2011-04-18 LAB — MAGNESIUM
Magnesium: 1.8
Magnesium: 2.1

## 2011-04-18 LAB — VANCOMYCIN, TROUGH
Vancomycin Tr: 13.4
Vancomycin Tr: 9.6

## 2011-04-18 LAB — URINE CULTURE

## 2011-04-19 LAB — DIFFERENTIAL
Basophils Relative: 3 — ABNORMAL HIGH
Eosinophils Relative: 3
Lymphocytes Relative: 29
Monocytes Relative: 13 — ABNORMAL HIGH
Neutro Abs: 4.1

## 2011-04-19 LAB — BASIC METABOLIC PANEL
BUN: 9
Calcium: 9.3
Creatinine, Ser: 0.53
Creatinine, Ser: 0.69
GFR calc Af Amer: >60
GFR calc Af Amer: >60
GFR calc non Af Amer: >60
GFR calc non Af Amer: >60
Potassium: 4
Sodium: 137

## 2011-04-19 LAB — CBC
Hemoglobin: 15.5
Platelets: 226
RBC: 4.97
RDW: 12.7
WBC: 7.8

## 2011-04-19 LAB — GENTAMICIN LEVEL, RANDOM: Gentamicin Rm: 2.2

## 2011-04-27 LAB — CBC
HCT: 45.9
Hemoglobin: 15.7
RBC: 4.81
RDW: 12.7

## 2011-04-27 LAB — URINALYSIS, ROUTINE W REFLEX MICROSCOPIC
Bilirubin Urine: NEGATIVE
Hgb urine dipstick: NEGATIVE
Nitrite: NEGATIVE
Protein, ur: 30 — AB
Specific Gravity, Urine: 1.038 — ABNORMAL HIGH
Urobilinogen, UA: 0.2

## 2011-04-27 LAB — BASIC METABOLIC PANEL
Calcium: 8.6
GFR calc Af Amer: >60
GFR calc non Af Amer: >60
Potassium: 4.4
Sodium: 141

## 2011-04-27 LAB — PHENOBARBITAL LEVEL: Phenobarbital: 20.9

## 2011-04-27 LAB — URINE CULTURE: Culture: NO GROWTH

## 2011-04-27 LAB — DIFFERENTIAL
Eosinophils Relative: 1
Lymphocytes Relative: 29
Monocytes Absolute: 0.8
Monocytes Relative: 10
Neutro Abs: 4.5

## 2011-04-27 LAB — URINE MICROSCOPIC-ADD ON: Urine-Other: NONE SEEN

## 2011-08-09 DIAGNOSIS — J159 Unspecified bacterial pneumonia: Secondary | ICD-10-CM | POA: Diagnosis not present

## 2011-08-09 DIAGNOSIS — J398 Other specified diseases of upper respiratory tract: Secondary | ICD-10-CM | POA: Diagnosis not present

## 2011-08-09 DIAGNOSIS — G809 Cerebral palsy, unspecified: Secondary | ICD-10-CM | POA: Diagnosis not present

## 2011-08-09 DIAGNOSIS — J984 Other disorders of lung: Secondary | ICD-10-CM | POA: Diagnosis not present

## 2011-08-09 DIAGNOSIS — K56609 Unspecified intestinal obstruction, unspecified as to partial versus complete obstruction: Secondary | ICD-10-CM | POA: Diagnosis not present

## 2011-10-08 DIAGNOSIS — J984 Other disorders of lung: Secondary | ICD-10-CM | POA: Diagnosis not present

## 2011-10-08 DIAGNOSIS — J398 Other specified diseases of upper respiratory tract: Secondary | ICD-10-CM | POA: Diagnosis not present

## 2011-10-08 DIAGNOSIS — K56609 Unspecified intestinal obstruction, unspecified as to partial versus complete obstruction: Secondary | ICD-10-CM | POA: Diagnosis not present

## 2011-10-08 DIAGNOSIS — G809 Cerebral palsy, unspecified: Secondary | ICD-10-CM | POA: Diagnosis not present

## 2011-10-08 DIAGNOSIS — J159 Unspecified bacterial pneumonia: Secondary | ICD-10-CM | POA: Diagnosis not present

## 2011-12-05 DIAGNOSIS — G809 Cerebral palsy, unspecified: Secondary | ICD-10-CM | POA: Diagnosis not present

## 2011-12-05 DIAGNOSIS — K56609 Unspecified intestinal obstruction, unspecified as to partial versus complete obstruction: Secondary | ICD-10-CM | POA: Diagnosis not present

## 2011-12-05 DIAGNOSIS — J984 Other disorders of lung: Secondary | ICD-10-CM | POA: Diagnosis not present

## 2011-12-05 DIAGNOSIS — J159 Unspecified bacterial pneumonia: Secondary | ICD-10-CM | POA: Diagnosis not present

## 2011-12-05 DIAGNOSIS — J398 Other specified diseases of upper respiratory tract: Secondary | ICD-10-CM | POA: Diagnosis not present

## 2012-02-03 DIAGNOSIS — K56609 Unspecified intestinal obstruction, unspecified as to partial versus complete obstruction: Secondary | ICD-10-CM | POA: Diagnosis not present

## 2012-02-03 DIAGNOSIS — G809 Cerebral palsy, unspecified: Secondary | ICD-10-CM | POA: Diagnosis not present

## 2012-02-03 DIAGNOSIS — J984 Other disorders of lung: Secondary | ICD-10-CM | POA: Diagnosis not present

## 2012-02-03 DIAGNOSIS — J398 Other specified diseases of upper respiratory tract: Secondary | ICD-10-CM | POA: Diagnosis not present

## 2012-02-03 DIAGNOSIS — J159 Unspecified bacterial pneumonia: Secondary | ICD-10-CM | POA: Diagnosis not present

## 2012-04-03 DIAGNOSIS — G809 Cerebral palsy, unspecified: Secondary | ICD-10-CM | POA: Diagnosis not present

## 2012-04-03 DIAGNOSIS — J984 Other disorders of lung: Secondary | ICD-10-CM | POA: Diagnosis not present

## 2012-04-03 DIAGNOSIS — J159 Unspecified bacterial pneumonia: Secondary | ICD-10-CM | POA: Diagnosis not present

## 2012-04-03 DIAGNOSIS — K56609 Unspecified intestinal obstruction, unspecified as to partial versus complete obstruction: Secondary | ICD-10-CM | POA: Diagnosis not present

## 2012-04-03 DIAGNOSIS — J398 Other specified diseases of upper respiratory tract: Secondary | ICD-10-CM | POA: Diagnosis not present

## 2012-08-01 DIAGNOSIS — J159 Unspecified bacterial pneumonia: Secondary | ICD-10-CM | POA: Diagnosis not present

## 2012-08-01 DIAGNOSIS — J398 Other specified diseases of upper respiratory tract: Secondary | ICD-10-CM | POA: Diagnosis not present

## 2012-08-01 DIAGNOSIS — K56609 Unspecified intestinal obstruction, unspecified as to partial versus complete obstruction: Secondary | ICD-10-CM | POA: Diagnosis not present

## 2012-08-01 DIAGNOSIS — G809 Cerebral palsy, unspecified: Secondary | ICD-10-CM | POA: Diagnosis not present

## 2012-08-01 DIAGNOSIS — J984 Other disorders of lung: Secondary | ICD-10-CM | POA: Diagnosis not present

## 2012-11-29 DIAGNOSIS — J984 Other disorders of lung: Secondary | ICD-10-CM | POA: Diagnosis not present

## 2012-11-29 DIAGNOSIS — J398 Other specified diseases of upper respiratory tract: Secondary | ICD-10-CM | POA: Diagnosis not present

## 2012-11-29 DIAGNOSIS — G809 Cerebral palsy, unspecified: Secondary | ICD-10-CM | POA: Diagnosis not present

## 2012-11-29 DIAGNOSIS — J159 Unspecified bacterial pneumonia: Secondary | ICD-10-CM | POA: Diagnosis not present

## 2012-11-29 DIAGNOSIS — K56609 Unspecified intestinal obstruction, unspecified as to partial versus complete obstruction: Secondary | ICD-10-CM | POA: Diagnosis not present

## 2013-01-28 DIAGNOSIS — J398 Other specified diseases of upper respiratory tract: Secondary | ICD-10-CM | POA: Diagnosis not present

## 2013-01-28 DIAGNOSIS — G809 Cerebral palsy, unspecified: Secondary | ICD-10-CM | POA: Diagnosis not present

## 2013-01-28 DIAGNOSIS — J984 Other disorders of lung: Secondary | ICD-10-CM | POA: Diagnosis not present

## 2013-01-28 DIAGNOSIS — J159 Unspecified bacterial pneumonia: Secondary | ICD-10-CM | POA: Diagnosis not present

## 2013-01-28 DIAGNOSIS — K56609 Unspecified intestinal obstruction, unspecified as to partial versus complete obstruction: Secondary | ICD-10-CM | POA: Diagnosis not present

## 2013-04-11 DIAGNOSIS — R05 Cough: Secondary | ICD-10-CM | POA: Diagnosis not present

## 2013-05-28 DIAGNOSIS — G809 Cerebral palsy, unspecified: Secondary | ICD-10-CM | POA: Diagnosis not present

## 2013-05-28 DIAGNOSIS — J984 Other disorders of lung: Secondary | ICD-10-CM | POA: Diagnosis not present

## 2013-05-28 DIAGNOSIS — J398 Other specified diseases of upper respiratory tract: Secondary | ICD-10-CM | POA: Diagnosis not present

## 2013-05-28 DIAGNOSIS — J159 Unspecified bacterial pneumonia: Secondary | ICD-10-CM | POA: Diagnosis not present

## 2013-05-28 DIAGNOSIS — K56609 Unspecified intestinal obstruction, unspecified as to partial versus complete obstruction: Secondary | ICD-10-CM | POA: Diagnosis not present

## 2013-06-17 DIAGNOSIS — Z79899 Other long term (current) drug therapy: Secondary | ICD-10-CM | POA: Diagnosis not present

## 2013-09-25 DIAGNOSIS — K56609 Unspecified intestinal obstruction, unspecified as to partial versus complete obstruction: Secondary | ICD-10-CM | POA: Diagnosis not present

## 2013-09-25 DIAGNOSIS — J398 Other specified diseases of upper respiratory tract: Secondary | ICD-10-CM | POA: Diagnosis not present

## 2013-09-25 DIAGNOSIS — G809 Cerebral palsy, unspecified: Secondary | ICD-10-CM | POA: Diagnosis not present

## 2013-09-25 DIAGNOSIS — J159 Unspecified bacterial pneumonia: Secondary | ICD-10-CM | POA: Diagnosis not present

## 2013-09-25 DIAGNOSIS — J984 Other disorders of lung: Secondary | ICD-10-CM | POA: Diagnosis not present

## 2013-11-24 DIAGNOSIS — J984 Other disorders of lung: Secondary | ICD-10-CM | POA: Diagnosis not present

## 2013-11-24 DIAGNOSIS — J398 Other specified diseases of upper respiratory tract: Secondary | ICD-10-CM | POA: Diagnosis not present

## 2013-11-24 DIAGNOSIS — J159 Unspecified bacterial pneumonia: Secondary | ICD-10-CM | POA: Diagnosis not present

## 2013-11-24 DIAGNOSIS — G809 Cerebral palsy, unspecified: Secondary | ICD-10-CM | POA: Diagnosis not present

## 2013-11-24 DIAGNOSIS — K56609 Unspecified intestinal obstruction, unspecified as to partial versus complete obstruction: Secondary | ICD-10-CM | POA: Diagnosis not present

## 2014-01-23 DIAGNOSIS — J159 Unspecified bacterial pneumonia: Secondary | ICD-10-CM | POA: Diagnosis not present

## 2014-01-23 DIAGNOSIS — J984 Other disorders of lung: Secondary | ICD-10-CM | POA: Diagnosis not present

## 2014-01-23 DIAGNOSIS — G809 Cerebral palsy, unspecified: Secondary | ICD-10-CM | POA: Diagnosis not present

## 2014-01-23 DIAGNOSIS — J398 Other specified diseases of upper respiratory tract: Secondary | ICD-10-CM | POA: Diagnosis not present

## 2014-01-23 DIAGNOSIS — K56609 Unspecified intestinal obstruction, unspecified as to partial versus complete obstruction: Secondary | ICD-10-CM | POA: Diagnosis not present

## 2014-03-08 DIAGNOSIS — G809 Cerebral palsy, unspecified: Secondary | ICD-10-CM | POA: Diagnosis not present

## 2014-03-08 DIAGNOSIS — Z430 Encounter for attention to tracheostomy: Secondary | ICD-10-CM | POA: Diagnosis not present

## 2014-03-15 DIAGNOSIS — R569 Unspecified convulsions: Secondary | ICD-10-CM | POA: Diagnosis not present

## 2014-03-15 DIAGNOSIS — Z79899 Other long term (current) drug therapy: Secondary | ICD-10-CM | POA: Diagnosis not present

## 2014-03-15 DIAGNOSIS — G809 Cerebral palsy, unspecified: Secondary | ICD-10-CM | POA: Diagnosis not present

## 2014-03-15 DIAGNOSIS — Z0000 Encounter for general adult medical examination without abnormal findings: Secondary | ICD-10-CM | POA: Diagnosis not present

## 2014-03-24 DIAGNOSIS — J189 Pneumonia, unspecified organism: Secondary | ICD-10-CM | POA: Diagnosis not present

## 2014-03-24 DIAGNOSIS — G809 Cerebral palsy, unspecified: Secondary | ICD-10-CM | POA: Diagnosis not present

## 2014-03-24 DIAGNOSIS — J988 Other specified respiratory disorders: Secondary | ICD-10-CM | POA: Diagnosis not present

## 2014-03-24 DIAGNOSIS — K56609 Unspecified intestinal obstruction, unspecified as to partial versus complete obstruction: Secondary | ICD-10-CM | POA: Diagnosis not present

## 2014-05-23 DIAGNOSIS — J159 Unspecified bacterial pneumonia: Secondary | ICD-10-CM | POA: Diagnosis not present

## 2014-05-23 DIAGNOSIS — G809 Cerebral palsy, unspecified: Secondary | ICD-10-CM | POA: Diagnosis not present

## 2014-05-23 DIAGNOSIS — K5660 Unspecified intestinal obstruction: Secondary | ICD-10-CM | POA: Diagnosis not present

## 2014-05-23 DIAGNOSIS — J398 Other specified diseases of upper respiratory tract: Secondary | ICD-10-CM | POA: Diagnosis not present

## 2014-05-24 DIAGNOSIS — Z430 Encounter for attention to tracheostomy: Secondary | ICD-10-CM | POA: Diagnosis not present

## 2014-07-01 ENCOUNTER — Ambulatory Visit: Payer: Medicare Other | Attending: Geriatric Medicine | Admitting: Rehabilitative and Restorative Service Providers"

## 2014-07-01 DIAGNOSIS — M24512 Contracture, left shoulder: Secondary | ICD-10-CM | POA: Diagnosis not present

## 2014-07-01 DIAGNOSIS — M24551 Contracture, right hip: Secondary | ICD-10-CM | POA: Insufficient documentation

## 2014-07-01 DIAGNOSIS — M24552 Contracture, left hip: Secondary | ICD-10-CM | POA: Insufficient documentation

## 2014-07-01 DIAGNOSIS — Z993 Dependence on wheelchair: Secondary | ICD-10-CM | POA: Insufficient documentation

## 2014-07-01 DIAGNOSIS — M6281 Muscle weakness (generalized): Secondary | ICD-10-CM | POA: Diagnosis not present

## 2014-07-01 DIAGNOSIS — M24511 Contracture, right shoulder: Secondary | ICD-10-CM | POA: Insufficient documentation

## 2014-07-01 DIAGNOSIS — M2450 Contracture, unspecified joint: Secondary | ICD-10-CM

## 2014-07-01 NOTE — Therapy (Signed)
[facility] [address] [place], [place], [address] Phone: [phone]   Fax:  [phone]  Physical Therapy Evaluation  Patient Details  Name: [name] MRN: [id number] Date of Birth: November 03, 1965  Encounter Date: 07/01/2014      PT End of Session - 07/01/14 1356    Visit Number 1   Number of Visits 1   PT Start Time 0935   PT Stop Time 1040   PT Time Calculation (min) 65 min   Equipment Utilized During Treatment --  Field seismologisthoyer pad     Wheelchair evaluation completed.  Document to be scanned.  After evaluating for w/c needs, PT recommends a manual tilt in space wheelchair with custom back/seating system to accommodate postural deformity.  Patient is working with Sharia ReeveJosh, [facility] at [facility] for w/c needs.    No past medical history on file.  No past surgical history on file.  There were no vitals taken for this visit.  Visit Diagnosis:  Contracture of joint   Problem List There are no active problems to display for this patient.   Thank you for the referral of this patient.  Margretta Dittyhristina Monae Topping, PT, MPT 07/01/2014 2:01 PM [facility] Phone: [phone] Fax: [phone]   [name] 07/01/2014, 1:59 PM

## 2014-07-13 ENCOUNTER — Encounter: Payer: Self-pay | Admitting: Rehabilitative and Restorative Service Providers"

## 2014-07-13 NOTE — Therapy (Addendum)
[facility] [address] [place], [place], [address] Phone: [phone]   Fax:  [phone]  Physical Therapy Evaluation  Patient Details  Name: [name] MRN: [id number] Date of Birth: 08-01-1965  Encounter Date: 07/01/2014  Visit Diagnosis:  Contracture of joint - Plan: PT plan of care cert/re-cert   Mobility/Seating Evaluation      PATIENT INFORMATION: Name: [name] DOB: 1966/06/06  Sex: M Date seen: 07/01/2014 Time: 0940  Address:  [place], [place] [address] Physician: Dr. Lajean Manes This evaluation/justification form will serve as the LMN for the following suppliers: __________________________ Supplier: [facility]  Contact Person: [employer] Phone:  ?????   Seating Therapist: Rudell Cobb, PT Phone:   [phone]   Phone: ?????    Spouse/Parent/Caregiver name: 24 hour nursing home care [facility] provides  *Brother attends session as well.  Phone number: ????? Insurance/Payer: Self Pay     Reason for Referral: wheelchair evaluation  Patient/Caregiver Goals: obtain new seating system   MEDICAL HISTORY: Diagnosis: Primary Diagnosis: Cerebral Palsy Onset: congenital Diagnosis: ?????   '[]' Progressive Disease Relevant past and future surgeries: ?????   Height: unable to obtain Weight: 110-130 range  Explain recent changes or trends in weight: ?????   History: The patient has h/o CP and is dependent for all movement/ADLs.  The patient lives with his mother and has 63 hour nursing care in the home.  The patient is wheelchair bound and uses a hoyer lift for all transfers in the home.         HOME ENVIRONMENT: '[x]' House  '[]' Condo/town home  '[]' Apartment  '[]' Assisted Living    '[]' Lives Alone '[x]'  Lives with Others                                                                                          Hours with caregiver: Mother is 32 and patient has 24 hour nursing   '[x]' Home is accessible to patient           Stairs      '[]' Yes '[]'  No     Ramp '[x]' Yes '[]' No Comments:  29" is maximum width to clear external door of the home Patient has electric bed, hoyer lift, manual tilt in space, feeding pump, suction machines, oxygen prn.   COMMUNITY ADL: TRANSPORTATION: '[]' Car    '[x]' Van    '[]' Public Transportation    '[]' Adapted w/c Lift    '[]' Ambulance    '[]' Other:       '[]' Sits in wheelchair during transport  Employment/School: n/a Specific requirements pertaining to mobility ?????  Other: Rents van for medical visits    FUNCTIONAL/SENSORY PROCESSING SKILLS:  Handedness:   '[]' Right     '[]' Left    '[]' NA  Comments:  not able to physically use UEs for power controls.  Functional Processing Skills for Wheeled Mobility '[]' Processing Skills are adequate for safe wheelchair operation  Areas of concern than may interfere with safe operation of wheelchair Description of problem   '[]'  Attention to environment      '[]' Judgment      '[]'  Hearing  '[]'  Vision or visual processing      '[]'   Motor Planning  '[]'  Fluctuations in Behavior  dependent for all mobility    VERBAL COMMUNICATION: '[]' WFL receptive '[]'  WFL expressive '[]' Understandable  '[]' Difficult to understand  '[x]' non-communicative '[]'  Uses an augmented communication device   SENSATION and SKIN ISSUES: Sensation '[]' Intact  '[]' Impaired '[]' Absent  Level of sensation: unable to assess due to communication barriers Pressure Relief: Able to perform effective pressure relief :    '[]' Yes  '[x]'  No Method: ???? If not, Why?: Will be dependent for all mobility  Skin Issues/Skin Integrity Current Skin Issues  '[x]' Yes '[]' No '[x]' Intact '[x]'  Red area'[]'  Open Area  '[]' Scar Tissue '[]' At risk from prolonged sitting Where  coccyx  History of Skin Issues  '[x]' Yes '[]' No Where  sacral/coccyx region When  redness  Hx of skin flap surgeries  '[]' Yes '[x]' No Where  ????? When  ?????  Limited sitting tolerance '[x]' Yes '[]' No Hours spent sitting in wheelchair daily: 3 hours in  the rmorning/ 3 hours in the evening  Complaint of Pain:  Please describe: Pt expresses some facial pain/grimacing with movements possibly due to joint stiffness/contractures (noted by nursing caregiver)   Swelling/Edema: None   ADL STATUS (in reference to wheelchair use):  Indep Assist Unable Indep with Equip Not assessed Comments  Dressing ????? x ????? ????? ????? ?????  Eating ????? x ????? ????? ????? 24 hour feeding tube  Toileting ????? x ????? ????? ????? ?????  Bathing ????? x ????? ????? ????? ?????  Grooming/Hygiene ????? x ????? ????? ????? ?????  Meal Prep ????? x ????? ????? ????? ?????  IADLS ????? x ????? ????? ????? ?????  Bowel Management: '[]' Continent  '[x]' Incontinent  '[]' Accidents Comments:  milk of magnesia every other day for bowel movements  Bladder Management: '[]' Continent  '[x]' Incontinent  '[]' Accidents Comments:  ?????    CURRENT SEATING / MOBILITY:  Current Mobility Base:  '[]' None '[]' Dependent '[x]' Manual '[]' Scooter '[]' Power  Type of Control: tilt in space  Manufacturer:  Action ATSize:  ?????Age: 40+ years    Current Condition of Mobility Base:  no longer meets patient's needs   Current Wheelchair components:  Arm positioning needs ( R UE), Improved accomodation of lower extremities, improved postural support,   Describe posture in present seating system:  patient is in reclined position in sacral sitting pattern with knees in flexion and heels to the R side (windswept position of legs/ knees angled to the right).    WHEELCHAIR SKILLS: Manual w/c Propulsion: '[]' UE or LE strength and endurance sufficient to participate in ADLs using manual wheelchair Arm : '[]' left '[]' right   '[]' Both      Distance: ????? Foot:  '[]' left '[]' right   '[]' Both  Operate Scooter: '[]'  Strength, hand grip, balance and transfer appropriate for use '[]' Living environment is accessible for use of scooter  Operate Power w/c:  '[]'  Std. Joystick   '[]'  Alternative Controls Indep '[]'  Assist '[]'  Dependent/unable '[x]'   N/A '[]'   '[]' Safe          '[]'  Functional      Distance: ?????  Bed confined without wheelchair '[x]'  Yes '[]'  No   STRENGTH/RANGE OF MOTION:  ????? Range of Motion Strength  Shoulder P/ROM 80 degrees flexion L shoulder P/ROM 40 degrees flexion R shoulder No functional movement of UEs  Elbow P/ROM 134 degrees of flexion contracture L elbow P/ROM  WNLs of R elbow ?????  Wrist/Hand L wrist in full flexion contracture R wrist in full flexion contracture ?????  Hip P/ROM in the L hip from 0-30 degrees of flexion  R Minimal P/ROM in the R  hip from 0-25 degrees of flexion No functional movement of LEs  Knee L knee maintained in 80 degrees of flexion and patient can obtain 70 degrees (from full extension) with P/ROM R knee maintained in 90 degrees of flexion at rest and can obtain 80 degrees with P/ROM ?????  Ankle Limited ankle P/ROM with significant contracture of 1st toe into flexion on both sides. ?????   MOBILITY/BALANCE:  '[x]'  Patient is totally dependent for mobility  ?????    Balance Transfers Ambulation  Sitting Balance: Standing Balance: '[]'  Independent '[]'  Independent/Modified Independent  '[]'  WFL     '[]'  WFL '[]'  Supervision '[]'  Supervision  '[]'  Uses UE for balance  '[]'  Supervision '[]'  Min Assist '[]'  Ambulates with Assist  ?????    '[]'  Min Assist '[]'  Min assist '[]'  Mod Assist '[]'  Ambulates with Device:      '[]'  RW  '[]'  StW  '[]'  Cane  '[]'  ?????  '[]'  Mod Assist '[]'  Mod assist '[]'  Max assist   '[]'  Max Assist '[]'  Max assist '[x]'  Dependent '[]'  Indep. Short Distance Only  '[x]'  Unable '[x]'  Unable '[]'  Lift / Sling Required Distance (in feet)  ?????   '[]'  Sliding board '[x]'  Unable to Ambulate (see explanation below)  Cardio Status:  '[]' Intact  '[]'  Impaired   '[]'  NA     ?????  Respiratory Status:  '[]' Intact   '[x]' Impaired   '[]' NA     ?????  Orthotics/Prosthetics: ?????  Comments: wheelchair bound     MAT EVALUATION: See vendor notes for measurements.    TRUNK Anterior / Posterior Left Right Rotation-shoulders and upper  trunk    ?????    '[]'  '[x]'  '[]'   WFL ? Thoracic ? Lumbar  Kyphosis Lordosis  '[]'  '[]'  '[]'   WFL Convex Convex  Right Left '[]' c-curve '[]' s-curve '[]' multiple  '[]'  Neutral '[]'  Left-anterior '[]'  Right-anterior     '[]'  Fixed '[]'  Flexible '[x]'  Partly Flexible '[]'  Other  '[]'  Fixed '[]'  Flexible '[]'  Partly Flexible '[]'  Other  '[]'  Fixed             '[]'  Flexible '[]'  Partly Flexible '[]'  Other    Position Windswept  ?????        HIPS                      '[]'            '[]'               '[x]'    Neutral       Abduct        ADduct         '[]'           '[x]'            '[]'   Neutral Right           Left      '[]'  Fixed '[x]'  Subluxed '[x]'  Partly Flexible '[]'  Dislocated '[]'  Flexible  '[]'  Fixed '[]'  Other '[x]'  Partly Flexible  '[]'  Flexible                 Foot Positioning Knee Positioning  wears ankle boots    '[]'  WFL  '[]' Lt '[]' Rt '[]'  WFL  '[]' Lt '[]' Rt    KNEES ROM concerns: ROM concerns:    & Dorsi-Flexed '[]' Lt '[]' Rt maintained in flexion contractures    FEET Plantar Flexed '[]' Lt '[]' Rt      Inversion                 '[]' Lt '[]' Rt  Eversion                 '[]' Lt '[]' Rt     HEAD '[]'  Functional '[]'  Good Head Control  ?????  & '[]'  Flexed         '[]'  Extended '[]'  Adequate Head Control    NECK '[x]'  Rotated  Lt  '[]'  Lat Flexed Lt '[]'  Rotated  Rt '[]'  Lat Flexed Rt '[x]'  Limited Head Control     '[]'  Cervical Hyperextension '[]'  Absent  Head Control     SHOULDERS ELBOWS WRIST& HAND No functional use of UEs, need R arm trough to support UE positioning; L maintained in flexion contracture.      Left     Right    Left     Right    Left     Right   U/E '[]' Functional           '[]' Functional ????? ????? '[]' Fisting             '[]' Fisting      '[]' elev   '[]' dep      '[]' elev   '[]' dep       '[]' pro -'[]' retract     '[]' pro  '[]' retract '[]' subluxed             '[]' subluxed         Goals for Wheelchair Mobility  '[]'  Independence with mobility in the home with motor related ADLs (MRADLs)  '[]'  Independence with MRADLs in the community '[x]'  Provide dependent mobility  '[]'  Provide recline      '[x]' Provide tilt   Goals for Seating system '[x]'  Optimize pressure distribution '[]'  Provide support needed to facilitate function or safety '[x]'  Provide corrective forces to assist with maintaining or improving posture '[x]'  Accommodate client's posture:   current seated postures and positions are not flexible or will not tolerate corrective forces '[]'  Client to be independent with relieving pressure in the wheelchair '[x]' Enhance physiological function such as breathing, swallowing, digestion  Simulation ideas/Equipment trials:Ottobach custom molded seating system recommended. State why other equipment was unsuccessful:Patient needs custom fit due to postural deformities   MOBILITY BASE RECOMMENDATIONS and JUSTIFICATION: MOBILITY COMPONENT JUSTIFICATION  Manufacturer: [employer] Model: Iris   Size: Width ?????Seat Depth  22" seat depth With True Fit *Attendant foot wheel lock mag* '[x]' provide transport from point A to B      '[]' promote Indep mobility  '[x]' is not a safe, functional ambulator '[x]' walker or cane inadequate '[x]' non-standard width/depth necessary to accommodate anatomical measurement '[]'  ?????  '[x]' Manual Mobility Base '[x]' non-functional ambulator    '[]' Scooter/POV  '[]' can safely operate  '[]' can safely transfer   '[]' has adequate trunk stability  '[]' cannot functionally propel manual w/c  '[]' Power Mobility Base  '[]' non-ambulatory  '[]' cannot functionally propel manual wheelchair  '[]'  cannot functionally and safely operate scooter/POV '[]' can safely operate and willing to  '[]' Stroller Base '[]' infant/child  '[]' unable to propel manual wheelchair '[]' allows for growth '[]' non-functional ambulator '[]' non-functional UE '[]' Indep mobility is not a goal at this time  '[x]' Tilt  '[]' Forward '[]' Backward '[]' Powered tilt  '[x]' Manual tilt  '[x]' change position against gravitational force on head and shoulders  '[x]' change position for pressure relief/cannot weight shift '[x]' transfers  '[]' management of tone '[x]' rest periods '[]' control  edema '[x]' facilitate postural control  '[]'  ?????  '[]' Recline  '[]' Power recline on power base '[]' Manual recline on manual base  '[x]' accommodate femur to back angle  '[x]' bring to full recline for ADL care  '[x]' change position for pressure relief/cannot weight shift '[x]' rest periods '[x]' repositioning for transfers or clothing/diaper /catheter changes '[x]' head positioning  '[]'   Lighter weight required '[]' self- propulsion  '[]' lifting '[]'  ?????  '[]' Heavy Duty required '[]' user weight greater than 250# '[]' extreme tone/ over active movement '[]' broken frame on previous chair '[]'  ?????  '[x]'  Back  '[]'  Angle Adjustable '[x]'  Custom molded Ottobock custom molded  Back cushion package '[x]' postural control '[]' control of tone/spasticity '[x]' accommodation of range of motion '[]' UE functional control '[x]' accommodation for seating system '[]'  ????? '[x]' provide lateral trunk support '[x]' accommodate deformity '[]' provide posterior trunk support '[x]' provide lumbar/sacral support '[]' support trunk in midline '[x]' Pressure relief over spinal processes  '[x]'  Seat Cushion Ottobock cushion package custom molded seat '[x]' impaired sensation  '[]' decubitus ulcers present '[x]' history of pressure ulceration '[]' prevent pelvic extension '[]' low maintenance  '[x]' stabilize pelvis  '[x]' accommodate obliquity '[x]' accommodate multiple deformity '[]' neutralize lower extremity position '[x]' increase pressure distribution '[]'  ?????  '[x]'  Pelvic/thigh support  '[]'  Lateral thigh guide '[x]'  Distal medial pad  '[]'  Distal lateral pad '[]'  pelvis in neutral '[]' accommodate pelvis '[x]'  position upper legs '[x]'  alignment '[]'  accommodate ROM '[]'  decr adduction '[]' accommodate tone '[x]' removable for transfers '[]' decr abduction  '[]'  Lateral trunk Supports '[]'  Lt     '[]'  Rt '[]' decrease lateral trunk leaning '[]' control tone '[]' contour for increased contact '[]' safety  '[]' accommodate asymmetry '[]'  ?????  '[x]'  Mounting hardware  '[]' lateral trunk supports  '[x]' back   '[]' seat '[x]' headrest      '[]'  thigh  support '[]' fixed   '[]' swing away '[x]' attach seat platform/cushion to w/c frame '[x]' attach back cushion to w/c frame '[]' mount postural supports '[x]' mount headrest  '[]' swing medial thigh support away '[]' swing lateral supports away for transfers  '[]'  ?????    Armrests  '[]' fixed '[x]' adjustable height '[x]' removable   '[]' swing away  '[]' flip back   '[]' reclining '[]' full length pads '[]' desk    '[]' pads tubular  '[]' provide support with elbow at 90   '[]' provide support for w/c tray '[]' change of height/angles for variable activities '[x]' remove for transfers '[]' allow to come closer to table top '[]' remove for access to tables '[x]'  arm trough 18"  Arm trough s/a hardware Dual post height adjst armrests x 2  Hangers/ Leg rests  '[]' 60 '[]' 70 '[]' 90 '[]' elevating '[]' heavy duty  '[]' articulating '[]' fixed '[]' lift off '[]' swing away     '[]' power '[]' provide LE support  '[]' accommodate to hamstring tightness '[]' elevate legs during recline   '[]' provide change in position for Legs '[]' Maintain placement of feet on footplate '[]' durability '[]' enable transfers '[]' decrease edema '[]' Accommodate lower leg length '[]'  ?????  Foot support Footplate    '[]' Lt  '[]'  Rt  '[]'  Center mount '[]' flip up     '[x]' depth/angle adjustable '[]' Amputee adapter    '[]'  Lt     '[]'  Rt '[x]' provide foot support '[x]' accommodate to ankle ROM '[]' transfers '[]' Provide support for residual extremity '[]'  allow foot to go under wheelchair base '[]'  decrease tone  '[x]'  custom molded foot support Heel loop x 2  '[]'  Ankle strap/heel loops '[]' support foot on foot support '[]' decrease extraneous movement '[]' provide input to heel  '[]' protect foot  Tires: '[x]' pneumatic  '[]' flat free inserts  '[]' solid  Airless insert x 2 '[x]' decrease maintenance  '[x]' prevent frequent flats '[]' increase shock absorbency '[]' decrease pain from road shock '[]' decrease spasms from road shock '[]'  ?????  '[x]'  Headrest  '[x]' provide posterior head support '[]' provide posterior neck support '[]' provide lateral head support '[]' provide anterior head  support '[x]' support during tilt and recline '[]' improve feeding   '[]' improve respiration '[]' placement of switches '[]' safety  '[]' accommodate ROM  '[]' accommodate tone '[]' improve visual orientation  '[]'  Anterior chest strap '[]'  Vest '[]'  Shoulder retractors  '[]' decrease forward movement of shoulder '[]' accommodation of TLSO '[]' decrease forward movement of trunk '[]' decrease shoulder elevation '[]' added  abdominal support '[]' alignment '[]' assistance with shoulder control  '[]'  ?????  Pelvic Positioner '[x]' Belt '[]' SubASIS bar '[]' Dual Pull '[]' stabilize tone '[x]' decrease falling out of chair/ **will not Decr potential for sliding due to pelvic tilting '[]' prevent excessive rotation '[]' pad for protection over boney prominence '[]' prominence comfort '[]' special pull angle to control rotation '[]'  ?????  Upper Extremity Support '[]' L   '[]'  R '[x]' Arm trough    '[]' hand support '[]'  tray       '[]' full tray '[]' swivel mount '[]' decrease edema      '[]' decrease subluxation   '[]' control tone   '[]' placement for AAC/Computer/EADL '[x]' decrease gravitational pull on shoulders '[x]' provide midline positioning '[]' provide support to increase UE function '[x]' provide hand support in natural position '[]' provide work surface   POWER WHEELCHAIR CONTROLS  '[]' Proportional  '[]' Non-Proportional Type ????? '[]' Left  '[]' Right '[]' provides access for controlling wheelchair   '[]' lacks motor control to operate proportional drive control '[]' unable to understand proportional controls  Actuator Control Module  '[]' Single  '[]' Multiple   '[]' Allow the client to operate the power seat function(s) through the joystick control   '[]' Safety Reset Switches '[]' Used to change modes and stop the wheelchair when driving in latch mode    '[]' Upgraded Electronics   '[]' programming for accurate control '[]' progressive Disease/changing condition '[]' non-proportional drive control needed '[]' Needed in order to operate power seat functions through joystick control   '[]' Display box '[]' Allows user to see in  which mode and drive the wheelchair is set  '[]' necessary for alternate controls    '[]' Digital interface electronics '[]' Allows w/c to operate when using alternative drive controls  '[]' ASL Head Array '[]' Allows client to operate wheelchair  through switches placed in tri-panel headrest  '[]' Sip and puff with tubing kit '[]' needed to operate sip and puff drive controls  '[]' Upgraded tracking electronics '[]' increase safety when driving '[]' correct tracking when on uneven surfaces  '[]' Mount for switches or joystick '[]' Attaches switches to w/c  '[]' Swing away for access or transfers '[]' midline for optimal placement '[]' provides for consistent access  '[]' Attendant controlled joystick plus mount '[]' safety '[]' long distance driving '[]' operation of seat functions '[]' compliance with transportation regulations '[]'  ?????    Patient name: [name], [name]   DOB: [birth year]  Rear wheel placement/Axle adjustability '[]' None '[]' semi adjustable '[]' fully adjustable  '[]' improved UE access to wheels '[]' improved stability '[]' changing angle in space for improvement of postural stability '[]' 1-arm drive access '[]' amputee pad placement '[]'  ?????  Wheel rims/ hand rims  '[]' metal  '[]' plastic coated '[]' oblique projections '[]' vertical projections '[]' Provide ability to propel manual wheelchair  '[]'  Increase self-propulsion with hand weakness/decreased grasp  Push handles '[]' extended  '[]' angle adjustable  '[]' standard '[]' caregiver access '[]' caregiver assist '[]' allows "hooking" to enable increased ability to perform ADLs or maintain balance  One armed device  '[]' Lt   '[]' Rt '[]' enable propulsion of manual wheelchair with one arm   '[]'  ?????   Brake/wheel lock extension '[]'  Lt   '[]'  Rt '[]' increase indep in applying wheel locks   '[]' Side guards '[]' prevent clothing getting caught in wheel or becoming soiled '[]'  prevent skin tears/abrasions  Battery: ????? '[]' to power wheelchair ?????  Other: ????? ????? ?????  The above equipment has a life- long use expectancy. Growth and changes  in medical and/or functional conditions would be the exceptions. This is to certify that the therapist has no financial relationship with durable medical provider or manufacturer. The therapist will not receive remuneration of any kind for the equipment recommended in this evaluation.   Patient has mobility limitation that significantly impairs safe, timely participation in one or more mobility related ADL's.  (bathing, toileting, feeding,  dressing, grooming, moving from room to room)                                                             '[x]'  Yes '[]'  No Will mobility device sufficiently improve ability to participate and/or be aided in participation of MRADL's?         '[x]'  Yes '[]'  No Can limitation be compensated for with use of a cane or walker?                                                                                '[]'  Yes '[x]'  No Does patient or caregiver demonstrate ability/potential ability & willingness to safely use the mobility device?   '[x]'  Yes '[]'  No Does patient's home environment support use of recommended mobility device?                                                    '[x]'  Yes '[]'  No Does patient have sufficient upper extremity function necessary to functionally propel a manual wheelchair?    '[]'  Yes '[x]'  No Does patient have sufficient strength and trunk stability to safely operate a POV (scooter)?                                  '[]'  Yes '[x]'  No Does patient need additional features/benefits provided by a power wheelchair for MRADL's in the home?       '[]'  Yes '[x]'  No Does the patient demonstrate the ability to safely use a power wheelchair?                                                              '[]'  Yes '[x]'  No  Therapist Name Printed: Rudell Cobb, MPT Date: 07/01/2014 1107am  Therapist's Signature:   Date:   Supplier's Name Printed: [employer]  [employer] Date: ?????  Supplier's Signature:   Date:  Patient/Caregiver Signature:   Date:     This is to certify  that I have read this evaluation and do agree with the content within:    Physician's Name Printed: Dr. Lajean Manes  Physician's Signature:  Date:     This is to certify that I, the above signed therapist have the following affiliations: '[]'  This DME provider '[]'  Manufacturer of recommended equipment '[]'  Patient's long term care facility '[]'  None of the above  Problem List There are no active problems to display for this patient.   Arrielle Mcginn 07/13/2014, 2:35 PM  [facility] [address] [place], [place], [address] Phone: [phone]   Fax:  [phone]

## 2014-07-22 DIAGNOSIS — J398 Other specified diseases of upper respiratory tract: Secondary | ICD-10-CM | POA: Diagnosis not present

## 2014-07-22 DIAGNOSIS — J80 Acute respiratory distress syndrome: Secondary | ICD-10-CM | POA: Diagnosis not present

## 2014-07-22 DIAGNOSIS — J159 Unspecified bacterial pneumonia: Secondary | ICD-10-CM | POA: Diagnosis not present

## 2014-07-22 DIAGNOSIS — G809 Cerebral palsy, unspecified: Secondary | ICD-10-CM | POA: Diagnosis not present

## 2014-07-22 DIAGNOSIS — K5660 Unspecified intestinal obstruction: Secondary | ICD-10-CM | POA: Diagnosis not present

## 2014-08-05 DIAGNOSIS — Z931 Gastrostomy status: Secondary | ICD-10-CM | POA: Diagnosis not present

## 2014-08-05 DIAGNOSIS — Z9889 Other specified postprocedural states: Secondary | ICD-10-CM | POA: Diagnosis not present

## 2014-08-05 DIAGNOSIS — G809 Cerebral palsy, unspecified: Secondary | ICD-10-CM | POA: Diagnosis not present

## 2014-09-20 DIAGNOSIS — J159 Unspecified bacterial pneumonia: Secondary | ICD-10-CM | POA: Diagnosis not present

## 2014-09-20 DIAGNOSIS — J398 Other specified diseases of upper respiratory tract: Secondary | ICD-10-CM | POA: Diagnosis not present

## 2014-09-20 DIAGNOSIS — G809 Cerebral palsy, unspecified: Secondary | ICD-10-CM | POA: Diagnosis not present

## 2014-10-03 DIAGNOSIS — M79609 Pain in unspecified limb: Secondary | ICD-10-CM | POA: Diagnosis not present

## 2014-11-19 DIAGNOSIS — J159 Unspecified bacterial pneumonia: Secondary | ICD-10-CM | POA: Diagnosis not present

## 2014-11-19 DIAGNOSIS — K5660 Unspecified intestinal obstruction: Secondary | ICD-10-CM | POA: Diagnosis not present

## 2014-11-19 DIAGNOSIS — J398 Other specified diseases of upper respiratory tract: Secondary | ICD-10-CM | POA: Diagnosis not present

## 2014-11-19 DIAGNOSIS — G809 Cerebral palsy, unspecified: Secondary | ICD-10-CM | POA: Diagnosis not present

## 2015-01-18 DIAGNOSIS — J398 Other specified diseases of upper respiratory tract: Secondary | ICD-10-CM | POA: Diagnosis not present

## 2015-01-18 DIAGNOSIS — G809 Cerebral palsy, unspecified: Secondary | ICD-10-CM | POA: Diagnosis not present

## 2015-01-18 DIAGNOSIS — K5660 Unspecified intestinal obstruction: Secondary | ICD-10-CM | POA: Diagnosis not present

## 2015-01-18 DIAGNOSIS — J159 Unspecified bacterial pneumonia: Secondary | ICD-10-CM | POA: Diagnosis not present

## 2015-01-18 DIAGNOSIS — J9610 Chronic respiratory failure, unspecified whether with hypoxia or hypercapnia: Secondary | ICD-10-CM | POA: Diagnosis not present

## 2015-02-04 DIAGNOSIS — G40909 Epilepsy, unspecified, not intractable, without status epilepticus: Secondary | ICD-10-CM | POA: Diagnosis not present

## 2015-03-19 DIAGNOSIS — K5660 Unspecified intestinal obstruction: Secondary | ICD-10-CM | POA: Diagnosis not present

## 2015-03-19 DIAGNOSIS — G809 Cerebral palsy, unspecified: Secondary | ICD-10-CM | POA: Diagnosis not present

## 2015-03-19 DIAGNOSIS — J398 Other specified diseases of upper respiratory tract: Secondary | ICD-10-CM | POA: Diagnosis not present

## 2015-03-19 DIAGNOSIS — J159 Unspecified bacterial pneumonia: Secondary | ICD-10-CM | POA: Diagnosis not present

## 2015-03-19 DIAGNOSIS — J9610 Chronic respiratory failure, unspecified whether with hypoxia or hypercapnia: Secondary | ICD-10-CM | POA: Diagnosis not present

## 2015-05-18 DIAGNOSIS — K5660 Unspecified intestinal obstruction: Secondary | ICD-10-CM | POA: Diagnosis not present

## 2015-05-18 DIAGNOSIS — J159 Unspecified bacterial pneumonia: Secondary | ICD-10-CM | POA: Diagnosis not present

## 2015-05-18 DIAGNOSIS — G809 Cerebral palsy, unspecified: Secondary | ICD-10-CM | POA: Diagnosis not present

## 2015-05-18 DIAGNOSIS — J9610 Chronic respiratory failure, unspecified whether with hypoxia or hypercapnia: Secondary | ICD-10-CM | POA: Diagnosis not present

## 2015-05-18 DIAGNOSIS — J398 Other specified diseases of upper respiratory tract: Secondary | ICD-10-CM | POA: Diagnosis not present

## 2015-07-17 DIAGNOSIS — K5660 Unspecified intestinal obstruction: Secondary | ICD-10-CM | POA: Diagnosis not present

## 2015-07-17 DIAGNOSIS — J398 Other specified diseases of upper respiratory tract: Secondary | ICD-10-CM | POA: Diagnosis not present

## 2015-07-17 DIAGNOSIS — J9610 Chronic respiratory failure, unspecified whether with hypoxia or hypercapnia: Secondary | ICD-10-CM | POA: Diagnosis not present

## 2015-07-17 DIAGNOSIS — G809 Cerebral palsy, unspecified: Secondary | ICD-10-CM | POA: Diagnosis not present

## 2015-07-17 DIAGNOSIS — J159 Unspecified bacterial pneumonia: Secondary | ICD-10-CM | POA: Diagnosis not present

## 2015-08-03 DIAGNOSIS — G40909 Epilepsy, unspecified, not intractable, without status epilepticus: Secondary | ICD-10-CM | POA: Diagnosis not present

## 2015-08-03 DIAGNOSIS — Z79899 Other long term (current) drug therapy: Secondary | ICD-10-CM | POA: Diagnosis not present

## 2015-09-05 DIAGNOSIS — I809 Phlebitis and thrombophlebitis of unspecified site: Secondary | ICD-10-CM | POA: Diagnosis not present

## 2015-09-05 DIAGNOSIS — I159 Secondary hypertension, unspecified: Secondary | ICD-10-CM | POA: Diagnosis not present

## 2015-09-05 DIAGNOSIS — J9610 Chronic respiratory failure, unspecified whether with hypoxia or hypercapnia: Secondary | ICD-10-CM | POA: Diagnosis not present

## 2015-09-05 DIAGNOSIS — K5660 Unspecified intestinal obstruction: Secondary | ICD-10-CM | POA: Diagnosis not present

## 2015-10-25 DIAGNOSIS — R05 Cough: Secondary | ICD-10-CM | POA: Diagnosis not present

## 2015-10-25 DIAGNOSIS — J811 Chronic pulmonary edema: Secondary | ICD-10-CM | POA: Diagnosis not present

## 2015-11-14 DIAGNOSIS — K5660 Unspecified intestinal obstruction: Secondary | ICD-10-CM | POA: Diagnosis not present

## 2015-11-14 DIAGNOSIS — J159 Unspecified bacterial pneumonia: Secondary | ICD-10-CM | POA: Diagnosis not present

## 2015-11-14 DIAGNOSIS — G809 Cerebral palsy, unspecified: Secondary | ICD-10-CM | POA: Diagnosis not present

## 2015-11-14 DIAGNOSIS — J9610 Chronic respiratory failure, unspecified whether with hypoxia or hypercapnia: Secondary | ICD-10-CM | POA: Diagnosis not present

## 2015-11-14 DIAGNOSIS — J398 Other specified diseases of upper respiratory tract: Secondary | ICD-10-CM | POA: Diagnosis not present

## 2016-01-13 DIAGNOSIS — G809 Cerebral palsy, unspecified: Secondary | ICD-10-CM | POA: Diagnosis not present

## 2016-01-13 DIAGNOSIS — J159 Unspecified bacterial pneumonia: Secondary | ICD-10-CM | POA: Diagnosis not present

## 2016-01-13 DIAGNOSIS — K5660 Unspecified intestinal obstruction: Secondary | ICD-10-CM | POA: Diagnosis not present

## 2016-01-13 DIAGNOSIS — J398 Other specified diseases of upper respiratory tract: Secondary | ICD-10-CM | POA: Diagnosis not present

## 2016-01-13 DIAGNOSIS — J9610 Chronic respiratory failure, unspecified whether with hypoxia or hypercapnia: Secondary | ICD-10-CM | POA: Diagnosis not present

## 2016-03-13 DIAGNOSIS — J398 Other specified diseases of upper respiratory tract: Secondary | ICD-10-CM | POA: Diagnosis not present

## 2016-03-13 DIAGNOSIS — J9610 Chronic respiratory failure, unspecified whether with hypoxia or hypercapnia: Secondary | ICD-10-CM | POA: Diagnosis not present

## 2016-03-13 DIAGNOSIS — J159 Unspecified bacterial pneumonia: Secondary | ICD-10-CM | POA: Diagnosis not present

## 2016-03-13 DIAGNOSIS — K5660 Unspecified intestinal obstruction: Secondary | ICD-10-CM | POA: Diagnosis not present

## 2016-05-12 DIAGNOSIS — J398 Other specified diseases of upper respiratory tract: Secondary | ICD-10-CM | POA: Diagnosis not present

## 2016-05-12 DIAGNOSIS — K56609 Unspecified intestinal obstruction, unspecified as to partial versus complete obstruction: Secondary | ICD-10-CM | POA: Diagnosis not present

## 2016-05-12 DIAGNOSIS — J159 Unspecified bacterial pneumonia: Secondary | ICD-10-CM | POA: Diagnosis not present

## 2016-05-12 DIAGNOSIS — G809 Cerebral palsy, unspecified: Secondary | ICD-10-CM | POA: Diagnosis not present

## 2016-05-12 DIAGNOSIS — J9610 Chronic respiratory failure, unspecified whether with hypoxia or hypercapnia: Secondary | ICD-10-CM | POA: Diagnosis not present

## 2016-07-11 DIAGNOSIS — G809 Cerebral palsy, unspecified: Secondary | ICD-10-CM | POA: Diagnosis not present

## 2016-07-11 DIAGNOSIS — K56609 Unspecified intestinal obstruction, unspecified as to partial versus complete obstruction: Secondary | ICD-10-CM | POA: Diagnosis not present

## 2016-07-11 DIAGNOSIS — J159 Unspecified bacterial pneumonia: Secondary | ICD-10-CM | POA: Diagnosis not present

## 2016-07-11 DIAGNOSIS — J398 Other specified diseases of upper respiratory tract: Secondary | ICD-10-CM | POA: Diagnosis not present

## 2016-09-09 DIAGNOSIS — J398 Other specified diseases of upper respiratory tract: Secondary | ICD-10-CM | POA: Diagnosis not present

## 2016-09-09 DIAGNOSIS — K56609 Unspecified intestinal obstruction, unspecified as to partial versus complete obstruction: Secondary | ICD-10-CM | POA: Diagnosis not present

## 2016-09-09 DIAGNOSIS — G809 Cerebral palsy, unspecified: Secondary | ICD-10-CM | POA: Diagnosis not present

## 2016-09-09 DIAGNOSIS — J9610 Chronic respiratory failure, unspecified whether with hypoxia or hypercapnia: Secondary | ICD-10-CM | POA: Diagnosis not present

## 2016-09-13 DIAGNOSIS — Z931 Gastrostomy status: Secondary | ICD-10-CM | POA: Diagnosis not present

## 2016-09-13 DIAGNOSIS — Z23 Encounter for immunization: Secondary | ICD-10-CM | POA: Diagnosis not present

## 2016-09-13 DIAGNOSIS — G40909 Epilepsy, unspecified, not intractable, without status epilepticus: Secondary | ICD-10-CM | POA: Diagnosis not present

## 2016-09-13 DIAGNOSIS — G809 Cerebral palsy, unspecified: Secondary | ICD-10-CM | POA: Diagnosis not present

## 2016-09-13 DIAGNOSIS — Z79899 Other long term (current) drug therapy: Secondary | ICD-10-CM | POA: Diagnosis not present

## 2016-09-13 DIAGNOSIS — K219 Gastro-esophageal reflux disease without esophagitis: Secondary | ICD-10-CM | POA: Diagnosis not present

## 2016-11-01 DIAGNOSIS — R195 Other fecal abnormalities: Secondary | ICD-10-CM | POA: Diagnosis not present

## 2016-11-29 DIAGNOSIS — R194 Change in bowel habit: Secondary | ICD-10-CM | POA: Diagnosis not present

## 2017-01-07 DIAGNOSIS — G809 Cerebral palsy, unspecified: Secondary | ICD-10-CM | POA: Diagnosis not present

## 2017-01-07 DIAGNOSIS — K56609 Unspecified intestinal obstruction, unspecified as to partial versus complete obstruction: Secondary | ICD-10-CM | POA: Diagnosis not present

## 2017-01-07 DIAGNOSIS — J9610 Chronic respiratory failure, unspecified whether with hypoxia or hypercapnia: Secondary | ICD-10-CM | POA: Diagnosis not present

## 2017-01-07 DIAGNOSIS — J159 Unspecified bacterial pneumonia: Secondary | ICD-10-CM | POA: Diagnosis not present

## 2017-01-07 DIAGNOSIS — J398 Other specified diseases of upper respiratory tract: Secondary | ICD-10-CM | POA: Diagnosis not present

## 2017-03-08 DIAGNOSIS — G809 Cerebral palsy, unspecified: Secondary | ICD-10-CM | POA: Diagnosis not present

## 2017-03-08 DIAGNOSIS — K56609 Unspecified intestinal obstruction, unspecified as to partial versus complete obstruction: Secondary | ICD-10-CM | POA: Diagnosis not present

## 2017-03-08 DIAGNOSIS — J159 Unspecified bacterial pneumonia: Secondary | ICD-10-CM | POA: Diagnosis not present

## 2017-03-08 DIAGNOSIS — J398 Other specified diseases of upper respiratory tract: Secondary | ICD-10-CM | POA: Diagnosis not present

## 2017-06-27 DIAGNOSIS — Z23 Encounter for immunization: Secondary | ICD-10-CM | POA: Diagnosis not present

## 2017-07-06 DIAGNOSIS — G809 Cerebral palsy, unspecified: Secondary | ICD-10-CM | POA: Diagnosis not present

## 2017-07-06 DIAGNOSIS — J398 Other specified diseases of upper respiratory tract: Secondary | ICD-10-CM | POA: Diagnosis not present

## 2017-07-06 DIAGNOSIS — J159 Unspecified bacterial pneumonia: Secondary | ICD-10-CM | POA: Diagnosis not present

## 2017-09-04 DIAGNOSIS — J398 Other specified diseases of upper respiratory tract: Secondary | ICD-10-CM | POA: Diagnosis not present

## 2017-09-04 DIAGNOSIS — G809 Cerebral palsy, unspecified: Secondary | ICD-10-CM | POA: Diagnosis not present

## 2017-09-04 DIAGNOSIS — J159 Unspecified bacterial pneumonia: Secondary | ICD-10-CM | POA: Diagnosis not present

## 2020-08-17 DIAGNOSIS — Z930 Tracheostomy status: Secondary | ICD-10-CM | POA: Diagnosis not present

## 2020-08-17 DIAGNOSIS — K56609 Unspecified intestinal obstruction, unspecified as to partial versus complete obstruction: Secondary | ICD-10-CM | POA: Diagnosis not present

## 2020-08-17 DIAGNOSIS — G809 Cerebral palsy, unspecified: Secondary | ICD-10-CM | POA: Diagnosis not present

## 2020-08-17 DIAGNOSIS — E46 Unspecified protein-calorie malnutrition: Secondary | ICD-10-CM | POA: Diagnosis not present

## 2020-08-22 DIAGNOSIS — G809 Cerebral palsy, unspecified: Secondary | ICD-10-CM | POA: Diagnosis not present

## 2020-08-22 DIAGNOSIS — E46 Unspecified protein-calorie malnutrition: Secondary | ICD-10-CM | POA: Diagnosis not present

## 2020-08-22 DIAGNOSIS — K56609 Unspecified intestinal obstruction, unspecified as to partial versus complete obstruction: Secondary | ICD-10-CM | POA: Diagnosis not present

## 2020-08-23 DIAGNOSIS — K56609 Unspecified intestinal obstruction, unspecified as to partial versus complete obstruction: Secondary | ICD-10-CM | POA: Diagnosis not present

## 2020-08-23 DIAGNOSIS — Z930 Tracheostomy status: Secondary | ICD-10-CM | POA: Diagnosis not present

## 2020-08-23 DIAGNOSIS — E46 Unspecified protein-calorie malnutrition: Secondary | ICD-10-CM | POA: Diagnosis not present

## 2020-08-23 DIAGNOSIS — G809 Cerebral palsy, unspecified: Secondary | ICD-10-CM | POA: Diagnosis not present

## 2020-09-01 DIAGNOSIS — Z931 Gastrostomy status: Secondary | ICD-10-CM | POA: Diagnosis not present

## 2020-09-01 DIAGNOSIS — G809 Cerebral palsy, unspecified: Secondary | ICD-10-CM | POA: Diagnosis not present

## 2020-09-09 DIAGNOSIS — R509 Fever, unspecified: Secondary | ICD-10-CM | POA: Diagnosis not present

## 2020-09-12 DIAGNOSIS — R509 Fever, unspecified: Secondary | ICD-10-CM | POA: Diagnosis not present

## 2020-09-22 DIAGNOSIS — Z930 Tracheostomy status: Secondary | ICD-10-CM | POA: Diagnosis not present

## 2020-09-22 DIAGNOSIS — E46 Unspecified protein-calorie malnutrition: Secondary | ICD-10-CM | POA: Diagnosis not present

## 2020-09-22 DIAGNOSIS — K56609 Unspecified intestinal obstruction, unspecified as to partial versus complete obstruction: Secondary | ICD-10-CM | POA: Diagnosis not present

## 2020-09-22 DIAGNOSIS — G809 Cerebral palsy, unspecified: Secondary | ICD-10-CM | POA: Diagnosis not present

## 2020-09-26 DIAGNOSIS — E46 Unspecified protein-calorie malnutrition: Secondary | ICD-10-CM | POA: Diagnosis not present

## 2020-09-26 DIAGNOSIS — G809 Cerebral palsy, unspecified: Secondary | ICD-10-CM | POA: Diagnosis not present

## 2020-09-26 DIAGNOSIS — K56609 Unspecified intestinal obstruction, unspecified as to partial versus complete obstruction: Secondary | ICD-10-CM | POA: Diagnosis not present

## 2020-10-19 DIAGNOSIS — Z930 Tracheostomy status: Secondary | ICD-10-CM | POA: Diagnosis not present

## 2020-10-19 DIAGNOSIS — G809 Cerebral palsy, unspecified: Secondary | ICD-10-CM | POA: Diagnosis not present

## 2020-10-19 DIAGNOSIS — K56609 Unspecified intestinal obstruction, unspecified as to partial versus complete obstruction: Secondary | ICD-10-CM | POA: Diagnosis not present

## 2020-10-19 DIAGNOSIS — E46 Unspecified protein-calorie malnutrition: Secondary | ICD-10-CM | POA: Diagnosis not present

## 2020-10-27 DIAGNOSIS — G809 Cerebral palsy, unspecified: Secondary | ICD-10-CM | POA: Diagnosis not present

## 2020-10-27 DIAGNOSIS — K56609 Unspecified intestinal obstruction, unspecified as to partial versus complete obstruction: Secondary | ICD-10-CM | POA: Diagnosis not present

## 2020-10-27 DIAGNOSIS — E46 Unspecified protein-calorie malnutrition: Secondary | ICD-10-CM | POA: Diagnosis not present

## 2020-11-17 DIAGNOSIS — K56609 Unspecified intestinal obstruction, unspecified as to partial versus complete obstruction: Secondary | ICD-10-CM | POA: Diagnosis not present

## 2020-11-17 DIAGNOSIS — E46 Unspecified protein-calorie malnutrition: Secondary | ICD-10-CM | POA: Diagnosis not present

## 2020-11-17 DIAGNOSIS — G809 Cerebral palsy, unspecified: Secondary | ICD-10-CM | POA: Diagnosis not present

## 2020-11-17 DIAGNOSIS — Z930 Tracheostomy status: Secondary | ICD-10-CM | POA: Diagnosis not present

## 2020-12-01 DIAGNOSIS — E46 Unspecified protein-calorie malnutrition: Secondary | ICD-10-CM | POA: Diagnosis not present

## 2020-12-01 DIAGNOSIS — K56609 Unspecified intestinal obstruction, unspecified as to partial versus complete obstruction: Secondary | ICD-10-CM | POA: Diagnosis not present

## 2020-12-01 DIAGNOSIS — G809 Cerebral palsy, unspecified: Secondary | ICD-10-CM | POA: Diagnosis not present

## 2020-12-15 DIAGNOSIS — E46 Unspecified protein-calorie malnutrition: Secondary | ICD-10-CM | POA: Diagnosis not present

## 2020-12-15 DIAGNOSIS — Z930 Tracheostomy status: Secondary | ICD-10-CM | POA: Diagnosis not present

## 2020-12-15 DIAGNOSIS — K56609 Unspecified intestinal obstruction, unspecified as to partial versus complete obstruction: Secondary | ICD-10-CM | POA: Diagnosis not present

## 2020-12-15 DIAGNOSIS — G809 Cerebral palsy, unspecified: Secondary | ICD-10-CM | POA: Diagnosis not present

## 2021-01-06 DIAGNOSIS — G809 Cerebral palsy, unspecified: Secondary | ICD-10-CM | POA: Diagnosis not present

## 2021-01-06 DIAGNOSIS — E46 Unspecified protein-calorie malnutrition: Secondary | ICD-10-CM | POA: Diagnosis not present

## 2021-01-06 DIAGNOSIS — K56609 Unspecified intestinal obstruction, unspecified as to partial versus complete obstruction: Secondary | ICD-10-CM | POA: Diagnosis not present

## 2021-01-11 DIAGNOSIS — E46 Unspecified protein-calorie malnutrition: Secondary | ICD-10-CM | POA: Diagnosis not present

## 2021-01-11 DIAGNOSIS — G809 Cerebral palsy, unspecified: Secondary | ICD-10-CM | POA: Diagnosis not present

## 2021-01-11 DIAGNOSIS — Z930 Tracheostomy status: Secondary | ICD-10-CM | POA: Diagnosis not present

## 2021-01-11 DIAGNOSIS — K56609 Unspecified intestinal obstruction, unspecified as to partial versus complete obstruction: Secondary | ICD-10-CM | POA: Diagnosis not present

## 2021-02-13 DIAGNOSIS — K56609 Unspecified intestinal obstruction, unspecified as to partial versus complete obstruction: Secondary | ICD-10-CM | POA: Diagnosis not present

## 2021-02-13 DIAGNOSIS — E46 Unspecified protein-calorie malnutrition: Secondary | ICD-10-CM | POA: Diagnosis not present

## 2021-02-13 DIAGNOSIS — G809 Cerebral palsy, unspecified: Secondary | ICD-10-CM | POA: Diagnosis not present

## 2021-02-14 DIAGNOSIS — E46 Unspecified protein-calorie malnutrition: Secondary | ICD-10-CM | POA: Diagnosis not present

## 2021-02-14 DIAGNOSIS — Z930 Tracheostomy status: Secondary | ICD-10-CM | POA: Diagnosis not present

## 2021-02-14 DIAGNOSIS — G809 Cerebral palsy, unspecified: Secondary | ICD-10-CM | POA: Diagnosis not present

## 2021-02-14 DIAGNOSIS — K56609 Unspecified intestinal obstruction, unspecified as to partial versus complete obstruction: Secondary | ICD-10-CM | POA: Diagnosis not present

## 2021-03-10 DIAGNOSIS — E46 Unspecified protein-calorie malnutrition: Secondary | ICD-10-CM | POA: Diagnosis not present

## 2021-03-10 DIAGNOSIS — K56609 Unspecified intestinal obstruction, unspecified as to partial versus complete obstruction: Secondary | ICD-10-CM | POA: Diagnosis not present

## 2021-03-10 DIAGNOSIS — G809 Cerebral palsy, unspecified: Secondary | ICD-10-CM | POA: Diagnosis not present

## 2021-03-14 DIAGNOSIS — R5381 Other malaise: Secondary | ICD-10-CM | POA: Diagnosis not present

## 2021-03-14 DIAGNOSIS — Z7401 Bed confinement status: Secondary | ICD-10-CM | POA: Diagnosis not present

## 2021-03-14 DIAGNOSIS — G809 Cerebral palsy, unspecified: Secondary | ICD-10-CM | POA: Diagnosis not present

## 2021-03-14 DIAGNOSIS — K5901 Slow transit constipation: Secondary | ICD-10-CM | POA: Diagnosis not present

## 2021-03-22 DIAGNOSIS — Z930 Tracheostomy status: Secondary | ICD-10-CM | POA: Diagnosis not present

## 2021-03-22 DIAGNOSIS — K56609 Unspecified intestinal obstruction, unspecified as to partial versus complete obstruction: Secondary | ICD-10-CM | POA: Diagnosis not present

## 2021-03-22 DIAGNOSIS — G809 Cerebral palsy, unspecified: Secondary | ICD-10-CM | POA: Diagnosis not present

## 2021-03-22 DIAGNOSIS — E46 Unspecified protein-calorie malnutrition: Secondary | ICD-10-CM | POA: Diagnosis not present

## 2021-04-17 DIAGNOSIS — K56609 Unspecified intestinal obstruction, unspecified as to partial versus complete obstruction: Secondary | ICD-10-CM | POA: Diagnosis not present

## 2021-04-17 DIAGNOSIS — E46 Unspecified protein-calorie malnutrition: Secondary | ICD-10-CM | POA: Diagnosis not present

## 2021-04-17 DIAGNOSIS — G809 Cerebral palsy, unspecified: Secondary | ICD-10-CM | POA: Diagnosis not present

## 2021-04-24 DIAGNOSIS — K56609 Unspecified intestinal obstruction, unspecified as to partial versus complete obstruction: Secondary | ICD-10-CM | POA: Diagnosis not present

## 2021-04-24 DIAGNOSIS — G809 Cerebral palsy, unspecified: Secondary | ICD-10-CM | POA: Diagnosis not present

## 2021-04-24 DIAGNOSIS — E46 Unspecified protein-calorie malnutrition: Secondary | ICD-10-CM | POA: Diagnosis not present

## 2021-04-24 DIAGNOSIS — Z930 Tracheostomy status: Secondary | ICD-10-CM | POA: Diagnosis not present

## 2021-05-18 DIAGNOSIS — E46 Unspecified protein-calorie malnutrition: Secondary | ICD-10-CM | POA: Diagnosis not present

## 2021-05-18 DIAGNOSIS — Z930 Tracheostomy status: Secondary | ICD-10-CM | POA: Diagnosis not present

## 2021-05-18 DIAGNOSIS — K56609 Unspecified intestinal obstruction, unspecified as to partial versus complete obstruction: Secondary | ICD-10-CM | POA: Diagnosis not present

## 2021-05-18 DIAGNOSIS — G809 Cerebral palsy, unspecified: Secondary | ICD-10-CM | POA: Diagnosis not present

## 2021-06-22 DIAGNOSIS — G809 Cerebral palsy, unspecified: Secondary | ICD-10-CM | POA: Diagnosis not present

## 2021-06-22 DIAGNOSIS — K56609 Unspecified intestinal obstruction, unspecified as to partial versus complete obstruction: Secondary | ICD-10-CM | POA: Diagnosis not present

## 2021-06-22 DIAGNOSIS — E46 Unspecified protein-calorie malnutrition: Secondary | ICD-10-CM | POA: Diagnosis not present

## 2021-06-29 DIAGNOSIS — G809 Cerebral palsy, unspecified: Secondary | ICD-10-CM | POA: Diagnosis not present

## 2021-06-29 DIAGNOSIS — K56609 Unspecified intestinal obstruction, unspecified as to partial versus complete obstruction: Secondary | ICD-10-CM | POA: Diagnosis not present

## 2021-06-29 DIAGNOSIS — Z930 Tracheostomy status: Secondary | ICD-10-CM | POA: Diagnosis not present

## 2021-06-29 DIAGNOSIS — E46 Unspecified protein-calorie malnutrition: Secondary | ICD-10-CM | POA: Diagnosis not present

## 2021-07-07 DIAGNOSIS — K9089 Other intestinal malabsorption: Secondary | ICD-10-CM | POA: Diagnosis not present

## 2021-07-07 DIAGNOSIS — K219 Gastro-esophageal reflux disease without esophagitis: Secondary | ICD-10-CM | POA: Diagnosis not present

## 2021-07-07 DIAGNOSIS — Z931 Gastrostomy status: Secondary | ICD-10-CM | POA: Diagnosis not present

## 2021-07-07 DIAGNOSIS — Z930 Tracheostomy status: Secondary | ICD-10-CM | POA: Diagnosis not present

## 2021-07-07 DIAGNOSIS — Z79899 Other long term (current) drug therapy: Secondary | ICD-10-CM | POA: Diagnosis not present

## 2021-07-07 DIAGNOSIS — G809 Cerebral palsy, unspecified: Secondary | ICD-10-CM | POA: Diagnosis not present

## 2021-07-07 DIAGNOSIS — F5101 Primary insomnia: Secondary | ICD-10-CM | POA: Diagnosis not present

## 2021-07-07 DIAGNOSIS — Z0000 Encounter for general adult medical examination without abnormal findings: Secondary | ICD-10-CM | POA: Diagnosis not present

## 2021-07-26 DIAGNOSIS — K56609 Unspecified intestinal obstruction, unspecified as to partial versus complete obstruction: Secondary | ICD-10-CM | POA: Diagnosis not present

## 2021-07-26 DIAGNOSIS — E46 Unspecified protein-calorie malnutrition: Secondary | ICD-10-CM | POA: Diagnosis not present

## 2021-07-26 DIAGNOSIS — G809 Cerebral palsy, unspecified: Secondary | ICD-10-CM | POA: Diagnosis not present

## 2021-07-27 DIAGNOSIS — Z930 Tracheostomy status: Secondary | ICD-10-CM | POA: Diagnosis not present

## 2021-07-27 DIAGNOSIS — G809 Cerebral palsy, unspecified: Secondary | ICD-10-CM | POA: Diagnosis not present

## 2021-07-27 DIAGNOSIS — E46 Unspecified protein-calorie malnutrition: Secondary | ICD-10-CM | POA: Diagnosis not present

## 2021-07-27 DIAGNOSIS — K56609 Unspecified intestinal obstruction, unspecified as to partial versus complete obstruction: Secondary | ICD-10-CM | POA: Diagnosis not present

## 2021-07-31 DIAGNOSIS — K56609 Unspecified intestinal obstruction, unspecified as to partial versus complete obstruction: Secondary | ICD-10-CM | POA: Diagnosis not present

## 2021-07-31 DIAGNOSIS — E46 Unspecified protein-calorie malnutrition: Secondary | ICD-10-CM | POA: Diagnosis not present

## 2021-07-31 DIAGNOSIS — G809 Cerebral palsy, unspecified: Secondary | ICD-10-CM | POA: Diagnosis not present

## 2021-08-09 DIAGNOSIS — E46 Unspecified protein-calorie malnutrition: Secondary | ICD-10-CM | POA: Diagnosis not present

## 2021-08-09 DIAGNOSIS — Z930 Tracheostomy status: Secondary | ICD-10-CM | POA: Diagnosis not present

## 2021-08-09 DIAGNOSIS — K56609 Unspecified intestinal obstruction, unspecified as to partial versus complete obstruction: Secondary | ICD-10-CM | POA: Diagnosis not present

## 2021-08-15 DIAGNOSIS — R6889 Other general symptoms and signs: Secondary | ICD-10-CM | POA: Diagnosis not present

## 2021-08-18 DIAGNOSIS — K56609 Unspecified intestinal obstruction, unspecified as to partial versus complete obstruction: Secondary | ICD-10-CM | POA: Diagnosis not present

## 2021-08-18 DIAGNOSIS — E46 Unspecified protein-calorie malnutrition: Secondary | ICD-10-CM | POA: Diagnosis not present

## 2021-08-18 DIAGNOSIS — R0602 Shortness of breath: Secondary | ICD-10-CM | POA: Diagnosis not present

## 2021-08-18 DIAGNOSIS — G809 Cerebral palsy, unspecified: Secondary | ICD-10-CM | POA: Diagnosis not present

## 2021-08-22 DIAGNOSIS — G809 Cerebral palsy, unspecified: Secondary | ICD-10-CM | POA: Diagnosis not present

## 2021-08-22 DIAGNOSIS — Z930 Tracheostomy status: Secondary | ICD-10-CM | POA: Diagnosis not present

## 2021-08-22 DIAGNOSIS — E46 Unspecified protein-calorie malnutrition: Secondary | ICD-10-CM | POA: Diagnosis not present

## 2021-08-22 DIAGNOSIS — K56609 Unspecified intestinal obstruction, unspecified as to partial versus complete obstruction: Secondary | ICD-10-CM | POA: Diagnosis not present

## 2021-08-29 DIAGNOSIS — E46 Unspecified protein-calorie malnutrition: Secondary | ICD-10-CM | POA: Diagnosis not present

## 2021-08-29 DIAGNOSIS — K56609 Unspecified intestinal obstruction, unspecified as to partial versus complete obstruction: Secondary | ICD-10-CM | POA: Diagnosis not present

## 2021-08-29 DIAGNOSIS — Z930 Tracheostomy status: Secondary | ICD-10-CM | POA: Diagnosis not present

## 2021-08-29 DIAGNOSIS — G809 Cerebral palsy, unspecified: Secondary | ICD-10-CM | POA: Diagnosis not present

## 2021-08-30 DIAGNOSIS — K56609 Unspecified intestinal obstruction, unspecified as to partial versus complete obstruction: Secondary | ICD-10-CM | POA: Diagnosis not present

## 2021-08-30 DIAGNOSIS — E46 Unspecified protein-calorie malnutrition: Secondary | ICD-10-CM | POA: Diagnosis not present

## 2021-08-30 DIAGNOSIS — G809 Cerebral palsy, unspecified: Secondary | ICD-10-CM | POA: Diagnosis not present

## 2021-08-30 DIAGNOSIS — Z930 Tracheostomy status: Secondary | ICD-10-CM | POA: Diagnosis not present

## 2021-09-07 DIAGNOSIS — Z930 Tracheostomy status: Secondary | ICD-10-CM | POA: Diagnosis not present

## 2021-09-07 DIAGNOSIS — G809 Cerebral palsy, unspecified: Secondary | ICD-10-CM | POA: Diagnosis not present

## 2021-09-07 DIAGNOSIS — E46 Unspecified protein-calorie malnutrition: Secondary | ICD-10-CM | POA: Diagnosis not present

## 2021-09-07 DIAGNOSIS — K56609 Unspecified intestinal obstruction, unspecified as to partial versus complete obstruction: Secondary | ICD-10-CM | POA: Diagnosis not present

## 2021-09-27 DIAGNOSIS — G809 Cerebral palsy, unspecified: Secondary | ICD-10-CM | POA: Diagnosis not present

## 2021-09-27 DIAGNOSIS — K56609 Unspecified intestinal obstruction, unspecified as to partial versus complete obstruction: Secondary | ICD-10-CM | POA: Diagnosis not present

## 2021-09-27 DIAGNOSIS — Z930 Tracheostomy status: Secondary | ICD-10-CM | POA: Diagnosis not present

## 2021-09-27 DIAGNOSIS — E46 Unspecified protein-calorie malnutrition: Secondary | ICD-10-CM | POA: Diagnosis not present

## 2021-10-16 DIAGNOSIS — G800 Spastic quadriplegic cerebral palsy: Secondary | ICD-10-CM | POA: Diagnosis not present

## 2021-10-16 DIAGNOSIS — Z930 Tracheostomy status: Secondary | ICD-10-CM | POA: Diagnosis not present

## 2021-10-16 DIAGNOSIS — Z931 Gastrostomy status: Secondary | ICD-10-CM | POA: Diagnosis not present

## 2021-10-23 DIAGNOSIS — K56609 Unspecified intestinal obstruction, unspecified as to partial versus complete obstruction: Secondary | ICD-10-CM | POA: Diagnosis not present

## 2021-10-23 DIAGNOSIS — E46 Unspecified protein-calorie malnutrition: Secondary | ICD-10-CM | POA: Diagnosis not present

## 2021-10-23 DIAGNOSIS — G809 Cerebral palsy, unspecified: Secondary | ICD-10-CM | POA: Diagnosis not present

## 2021-10-25 DIAGNOSIS — Z930 Tracheostomy status: Secondary | ICD-10-CM | POA: Diagnosis not present

## 2021-10-25 DIAGNOSIS — E46 Unspecified protein-calorie malnutrition: Secondary | ICD-10-CM | POA: Diagnosis not present

## 2021-10-25 DIAGNOSIS — K56609 Unspecified intestinal obstruction, unspecified as to partial versus complete obstruction: Secondary | ICD-10-CM | POA: Diagnosis not present

## 2021-10-25 DIAGNOSIS — G809 Cerebral palsy, unspecified: Secondary | ICD-10-CM | POA: Diagnosis not present

## 2021-11-21 DIAGNOSIS — E46 Unspecified protein-calorie malnutrition: Secondary | ICD-10-CM | POA: Diagnosis not present

## 2021-11-21 DIAGNOSIS — K56609 Unspecified intestinal obstruction, unspecified as to partial versus complete obstruction: Secondary | ICD-10-CM | POA: Diagnosis not present

## 2021-11-21 DIAGNOSIS — G809 Cerebral palsy, unspecified: Secondary | ICD-10-CM | POA: Diagnosis not present

## 2021-11-24 DIAGNOSIS — G809 Cerebral palsy, unspecified: Secondary | ICD-10-CM | POA: Diagnosis not present

## 2021-11-24 DIAGNOSIS — E46 Unspecified protein-calorie malnutrition: Secondary | ICD-10-CM | POA: Diagnosis not present

## 2021-11-24 DIAGNOSIS — Z930 Tracheostomy status: Secondary | ICD-10-CM | POA: Diagnosis not present

## 2021-11-24 DIAGNOSIS — K56609 Unspecified intestinal obstruction, unspecified as to partial versus complete obstruction: Secondary | ICD-10-CM | POA: Diagnosis not present

## 2021-12-26 DIAGNOSIS — E46 Unspecified protein-calorie malnutrition: Secondary | ICD-10-CM | POA: Diagnosis not present

## 2021-12-26 DIAGNOSIS — K56609 Unspecified intestinal obstruction, unspecified as to partial versus complete obstruction: Secondary | ICD-10-CM | POA: Diagnosis not present

## 2021-12-26 DIAGNOSIS — Z930 Tracheostomy status: Secondary | ICD-10-CM | POA: Diagnosis not present

## 2021-12-26 DIAGNOSIS — G809 Cerebral palsy, unspecified: Secondary | ICD-10-CM | POA: Diagnosis not present

## 2021-12-28 DIAGNOSIS — E46 Unspecified protein-calorie malnutrition: Secondary | ICD-10-CM | POA: Diagnosis not present

## 2021-12-28 DIAGNOSIS — G809 Cerebral palsy, unspecified: Secondary | ICD-10-CM | POA: Diagnosis not present

## 2021-12-28 DIAGNOSIS — K56609 Unspecified intestinal obstruction, unspecified as to partial versus complete obstruction: Secondary | ICD-10-CM | POA: Diagnosis not present

## 2022-01-10 DIAGNOSIS — R601 Generalized edema: Secondary | ICD-10-CM | POA: Diagnosis not present

## 2022-01-12 DIAGNOSIS — M79671 Pain in right foot: Secondary | ICD-10-CM | POA: Diagnosis not present

## 2022-01-17 DIAGNOSIS — S82221A Displaced transverse fracture of shaft of right tibia, initial encounter for closed fracture: Secondary | ICD-10-CM | POA: Diagnosis not present

## 2022-01-17 DIAGNOSIS — M25571 Pain in right ankle and joints of right foot: Secondary | ICD-10-CM | POA: Diagnosis not present

## 2022-01-18 DIAGNOSIS — K56609 Unspecified intestinal obstruction, unspecified as to partial versus complete obstruction: Secondary | ICD-10-CM | POA: Diagnosis not present

## 2022-01-18 DIAGNOSIS — G809 Cerebral palsy, unspecified: Secondary | ICD-10-CM | POA: Diagnosis not present

## 2022-01-18 DIAGNOSIS — E46 Unspecified protein-calorie malnutrition: Secondary | ICD-10-CM | POA: Diagnosis not present

## 2022-01-18 DIAGNOSIS — Z930 Tracheostomy status: Secondary | ICD-10-CM | POA: Diagnosis not present

## 2022-01-26 DIAGNOSIS — G809 Cerebral palsy, unspecified: Secondary | ICD-10-CM | POA: Diagnosis not present

## 2022-01-26 DIAGNOSIS — E46 Unspecified protein-calorie malnutrition: Secondary | ICD-10-CM | POA: Diagnosis not present

## 2022-01-26 DIAGNOSIS — K56609 Unspecified intestinal obstruction, unspecified as to partial versus complete obstruction: Secondary | ICD-10-CM | POA: Diagnosis not present

## 2022-02-02 DIAGNOSIS — G809 Cerebral palsy, unspecified: Secondary | ICD-10-CM | POA: Diagnosis not present

## 2022-02-16 DIAGNOSIS — S82221D Displaced transverse fracture of shaft of right tibia, subsequent encounter for closed fracture with routine healing: Secondary | ICD-10-CM | POA: Diagnosis not present

## 2022-02-16 DIAGNOSIS — S82221A Displaced transverse fracture of shaft of right tibia, initial encounter for closed fracture: Secondary | ICD-10-CM | POA: Diagnosis not present

## 2022-02-22 DIAGNOSIS — E46 Unspecified protein-calorie malnutrition: Secondary | ICD-10-CM | POA: Diagnosis not present

## 2022-02-22 DIAGNOSIS — Z930 Tracheostomy status: Secondary | ICD-10-CM | POA: Diagnosis not present

## 2022-02-22 DIAGNOSIS — G809 Cerebral palsy, unspecified: Secondary | ICD-10-CM | POA: Diagnosis not present

## 2022-02-22 DIAGNOSIS — K56609 Unspecified intestinal obstruction, unspecified as to partial versus complete obstruction: Secondary | ICD-10-CM | POA: Diagnosis not present

## 2022-03-14 DIAGNOSIS — S82221D Displaced transverse fracture of shaft of right tibia, subsequent encounter for closed fracture with routine healing: Secondary | ICD-10-CM | POA: Diagnosis not present

## 2022-03-23 DIAGNOSIS — E46 Unspecified protein-calorie malnutrition: Secondary | ICD-10-CM | POA: Diagnosis not present

## 2022-03-23 DIAGNOSIS — G809 Cerebral palsy, unspecified: Secondary | ICD-10-CM | POA: Diagnosis not present

## 2022-03-23 DIAGNOSIS — K56609 Unspecified intestinal obstruction, unspecified as to partial versus complete obstruction: Secondary | ICD-10-CM | POA: Diagnosis not present

## 2022-03-26 DIAGNOSIS — E46 Unspecified protein-calorie malnutrition: Secondary | ICD-10-CM | POA: Diagnosis not present

## 2022-03-26 DIAGNOSIS — G809 Cerebral palsy, unspecified: Secondary | ICD-10-CM | POA: Diagnosis not present

## 2022-03-26 DIAGNOSIS — K56609 Unspecified intestinal obstruction, unspecified as to partial versus complete obstruction: Secondary | ICD-10-CM | POA: Diagnosis not present

## 2022-03-26 DIAGNOSIS — Z930 Tracheostomy status: Secondary | ICD-10-CM | POA: Diagnosis not present

## 2022-03-29 DIAGNOSIS — E46 Unspecified protein-calorie malnutrition: Secondary | ICD-10-CM | POA: Diagnosis not present

## 2022-03-29 DIAGNOSIS — Z931 Gastrostomy status: Secondary | ICD-10-CM | POA: Diagnosis not present

## 2022-03-29 DIAGNOSIS — K56609 Unspecified intestinal obstruction, unspecified as to partial versus complete obstruction: Secondary | ICD-10-CM | POA: Diagnosis not present

## 2022-03-29 DIAGNOSIS — G809 Cerebral palsy, unspecified: Secondary | ICD-10-CM | POA: Diagnosis not present

## 2022-04-24 DIAGNOSIS — G809 Cerebral palsy, unspecified: Secondary | ICD-10-CM | POA: Diagnosis not present

## 2022-04-24 DIAGNOSIS — E46 Unspecified protein-calorie malnutrition: Secondary | ICD-10-CM | POA: Diagnosis not present

## 2022-04-24 DIAGNOSIS — K56609 Unspecified intestinal obstruction, unspecified as to partial versus complete obstruction: Secondary | ICD-10-CM | POA: Diagnosis not present

## 2022-04-24 DIAGNOSIS — Z930 Tracheostomy status: Secondary | ICD-10-CM | POA: Diagnosis not present

## 2022-04-26 DIAGNOSIS — E46 Unspecified protein-calorie malnutrition: Secondary | ICD-10-CM | POA: Diagnosis not present

## 2022-04-26 DIAGNOSIS — K56609 Unspecified intestinal obstruction, unspecified as to partial versus complete obstruction: Secondary | ICD-10-CM | POA: Diagnosis not present

## 2022-04-26 DIAGNOSIS — G809 Cerebral palsy, unspecified: Secondary | ICD-10-CM | POA: Diagnosis not present

## 2022-04-26 DIAGNOSIS — Z930 Tracheostomy status: Secondary | ICD-10-CM | POA: Diagnosis not present

## 2022-05-14 DIAGNOSIS — S82221D Displaced transverse fracture of shaft of right tibia, subsequent encounter for closed fracture with routine healing: Secondary | ICD-10-CM | POA: Diagnosis not present

## 2022-05-24 DIAGNOSIS — K56609 Unspecified intestinal obstruction, unspecified as to partial versus complete obstruction: Secondary | ICD-10-CM | POA: Diagnosis not present

## 2022-05-24 DIAGNOSIS — E46 Unspecified protein-calorie malnutrition: Secondary | ICD-10-CM | POA: Diagnosis not present

## 2022-05-24 DIAGNOSIS — G809 Cerebral palsy, unspecified: Secondary | ICD-10-CM | POA: Diagnosis not present

## 2022-05-25 DIAGNOSIS — E46 Unspecified protein-calorie malnutrition: Secondary | ICD-10-CM | POA: Diagnosis not present

## 2022-05-25 DIAGNOSIS — G809 Cerebral palsy, unspecified: Secondary | ICD-10-CM | POA: Diagnosis not present

## 2022-05-25 DIAGNOSIS — K56609 Unspecified intestinal obstruction, unspecified as to partial versus complete obstruction: Secondary | ICD-10-CM | POA: Diagnosis not present

## 2022-05-25 DIAGNOSIS — Z930 Tracheostomy status: Secondary | ICD-10-CM | POA: Diagnosis not present

## 2022-06-21 DIAGNOSIS — E46 Unspecified protein-calorie malnutrition: Secondary | ICD-10-CM | POA: Diagnosis not present

## 2022-06-21 DIAGNOSIS — G809 Cerebral palsy, unspecified: Secondary | ICD-10-CM | POA: Diagnosis not present

## 2022-06-21 DIAGNOSIS — K56609 Unspecified intestinal obstruction, unspecified as to partial versus complete obstruction: Secondary | ICD-10-CM | POA: Diagnosis not present

## 2022-06-21 DIAGNOSIS — Z930 Tracheostomy status: Secondary | ICD-10-CM | POA: Diagnosis not present

## 2022-06-29 DIAGNOSIS — G809 Cerebral palsy, unspecified: Secondary | ICD-10-CM | POA: Diagnosis not present

## 2022-06-29 DIAGNOSIS — E46 Unspecified protein-calorie malnutrition: Secondary | ICD-10-CM | POA: Diagnosis not present

## 2022-06-29 DIAGNOSIS — K56609 Unspecified intestinal obstruction, unspecified as to partial versus complete obstruction: Secondary | ICD-10-CM | POA: Diagnosis not present

## 2022-07-02 DIAGNOSIS — R3911 Hesitancy of micturition: Secondary | ICD-10-CM | POA: Diagnosis not present

## 2022-07-03 ENCOUNTER — Other Ambulatory Visit: Payer: Self-pay | Admitting: Internal Medicine

## 2022-07-03 ENCOUNTER — Ambulatory Visit
Admission: RE | Admit: 2022-07-03 | Discharge: 2022-07-03 | Disposition: A | Discharge status: Home / Self Care | Payer: Medicare Other | Source: Ambulatory Visit | Attending: Internal Medicine | Admitting: Internal Medicine

## 2022-07-03 DIAGNOSIS — Z79899 Other long term (current) drug therapy: Secondary | ICD-10-CM | POA: Diagnosis not present

## 2022-07-03 DIAGNOSIS — K5901 Slow transit constipation: Secondary | ICD-10-CM | POA: Diagnosis not present

## 2022-07-03 DIAGNOSIS — B370 Candidal stomatitis: Secondary | ICD-10-CM | POA: Diagnosis not present

## 2022-07-03 DIAGNOSIS — Z931 Gastrostomy status: Secondary | ICD-10-CM | POA: Diagnosis not present

## 2022-07-03 DIAGNOSIS — R059 Cough, unspecified: Secondary | ICD-10-CM | POA: Diagnosis not present

## 2022-07-03 DIAGNOSIS — R051 Acute cough: Secondary | ICD-10-CM

## 2022-07-03 DIAGNOSIS — R509 Fever, unspecified: Secondary | ICD-10-CM | POA: Diagnosis not present

## 2022-07-03 DIAGNOSIS — K219 Gastro-esophageal reflux disease without esophagitis: Secondary | ICD-10-CM | POA: Diagnosis not present

## 2022-07-03 DIAGNOSIS — G809 Cerebral palsy, unspecified: Secondary | ICD-10-CM | POA: Diagnosis not present

## 2022-07-03 DIAGNOSIS — Z930 Tracheostomy status: Secondary | ICD-10-CM | POA: Diagnosis not present

## 2022-07-09 DIAGNOSIS — R051 Acute cough: Secondary | ICD-10-CM | POA: Diagnosis not present

## 2022-07-09 DIAGNOSIS — Z0000 Encounter for general adult medical examination without abnormal findings: Secondary | ICD-10-CM | POA: Diagnosis not present

## 2022-07-09 DIAGNOSIS — Z930 Tracheostomy status: Secondary | ICD-10-CM | POA: Diagnosis not present

## 2022-07-09 DIAGNOSIS — Z79899 Other long term (current) drug therapy: Secondary | ICD-10-CM | POA: Diagnosis not present

## 2022-07-09 DIAGNOSIS — B370 Candidal stomatitis: Secondary | ICD-10-CM | POA: Diagnosis not present

## 2022-07-09 DIAGNOSIS — K219 Gastro-esophageal reflux disease without esophagitis: Secondary | ICD-10-CM | POA: Diagnosis not present

## 2022-07-09 DIAGNOSIS — K5901 Slow transit constipation: Secondary | ICD-10-CM | POA: Diagnosis not present

## 2022-09-24 DIAGNOSIS — K56609 Unspecified intestinal obstruction, unspecified as to partial versus complete obstruction: Secondary | ICD-10-CM | POA: Diagnosis not present

## 2022-09-24 DIAGNOSIS — E46 Unspecified protein-calorie malnutrition: Secondary | ICD-10-CM | POA: Diagnosis not present

## 2022-09-24 DIAGNOSIS — Z930 Tracheostomy status: Secondary | ICD-10-CM | POA: Diagnosis not present

## 2022-09-24 DIAGNOSIS — G809 Cerebral palsy, unspecified: Secondary | ICD-10-CM | POA: Diagnosis not present

## 2022-09-26 DIAGNOSIS — G809 Cerebral palsy, unspecified: Secondary | ICD-10-CM | POA: Diagnosis not present

## 2022-09-26 DIAGNOSIS — K56609 Unspecified intestinal obstruction, unspecified as to partial versus complete obstruction: Secondary | ICD-10-CM | POA: Diagnosis not present

## 2022-09-26 DIAGNOSIS — E46 Unspecified protein-calorie malnutrition: Secondary | ICD-10-CM | POA: Diagnosis not present

## 2022-09-27 DIAGNOSIS — G809 Cerebral palsy, unspecified: Secondary | ICD-10-CM | POA: Diagnosis not present

## 2022-09-27 DIAGNOSIS — K56609 Unspecified intestinal obstruction, unspecified as to partial versus complete obstruction: Secondary | ICD-10-CM | POA: Diagnosis not present

## 2022-09-27 DIAGNOSIS — E46 Unspecified protein-calorie malnutrition: Secondary | ICD-10-CM | POA: Diagnosis not present

## 2022-10-10 DIAGNOSIS — G809 Cerebral palsy, unspecified: Secondary | ICD-10-CM | POA: Diagnosis not present

## 2022-10-10 DIAGNOSIS — E46 Unspecified protein-calorie malnutrition: Secondary | ICD-10-CM | POA: Diagnosis not present

## 2022-10-10 DIAGNOSIS — Z930 Tracheostomy status: Secondary | ICD-10-CM | POA: Diagnosis not present

## 2022-10-10 DIAGNOSIS — K56609 Unspecified intestinal obstruction, unspecified as to partial versus complete obstruction: Secondary | ICD-10-CM | POA: Diagnosis not present

## 2022-10-26 DIAGNOSIS — G809 Cerebral palsy, unspecified: Secondary | ICD-10-CM | POA: Diagnosis not present

## 2022-10-26 DIAGNOSIS — Z930 Tracheostomy status: Secondary | ICD-10-CM | POA: Diagnosis not present

## 2022-10-26 DIAGNOSIS — K56609 Unspecified intestinal obstruction, unspecified as to partial versus complete obstruction: Secondary | ICD-10-CM | POA: Diagnosis not present

## 2022-10-26 DIAGNOSIS — E46 Unspecified protein-calorie malnutrition: Secondary | ICD-10-CM | POA: Diagnosis not present

## 2022-10-27 DIAGNOSIS — K56609 Unspecified intestinal obstruction, unspecified as to partial versus complete obstruction: Secondary | ICD-10-CM | POA: Diagnosis not present

## 2022-10-27 DIAGNOSIS — G809 Cerebral palsy, unspecified: Secondary | ICD-10-CM | POA: Diagnosis not present

## 2022-10-27 DIAGNOSIS — E46 Unspecified protein-calorie malnutrition: Secondary | ICD-10-CM | POA: Diagnosis not present

## 2022-10-31 DIAGNOSIS — G809 Cerebral palsy, unspecified: Secondary | ICD-10-CM | POA: Diagnosis not present

## 2022-10-31 DIAGNOSIS — K56609 Unspecified intestinal obstruction, unspecified as to partial versus complete obstruction: Secondary | ICD-10-CM | POA: Diagnosis not present

## 2022-10-31 DIAGNOSIS — E46 Unspecified protein-calorie malnutrition: Secondary | ICD-10-CM | POA: Diagnosis not present

## 2022-11-22 DIAGNOSIS — E46 Unspecified protein-calorie malnutrition: Secondary | ICD-10-CM | POA: Diagnosis not present

## 2022-11-22 DIAGNOSIS — K56609 Unspecified intestinal obstruction, unspecified as to partial versus complete obstruction: Secondary | ICD-10-CM | POA: Diagnosis not present

## 2022-11-22 DIAGNOSIS — G809 Cerebral palsy, unspecified: Secondary | ICD-10-CM | POA: Diagnosis not present

## 2022-11-22 DIAGNOSIS — Z930 Tracheostomy status: Secondary | ICD-10-CM | POA: Diagnosis not present

## 2022-11-23 DIAGNOSIS — I499 Cardiac arrhythmia, unspecified: Secondary | ICD-10-CM | POA: Diagnosis not present

## 2022-11-23 DIAGNOSIS — R404 Transient alteration of awareness: Secondary | ICD-10-CM | POA: Diagnosis not present

## 2022-11-23 DIAGNOSIS — R55 Syncope and collapse: Secondary | ICD-10-CM | POA: Diagnosis not present

## 2022-12-22 DIAGNOSIS — 419620001 Death: Secondary | SNOMED CT | POA: Diagnosis not present

## 2022-12-22 DEATH — deceased
